# Patient Record
Sex: Male | Born: 1970 | Race: White | Hispanic: No | Marital: Married | Smoking: Current every day smoker
Health system: Southern US, Community
[De-identification: ages and names within clinical notes are randomized; demographics above are authoritative.]

## PROBLEM LIST (undated history)

## (undated) DIAGNOSIS — M109 Gout, unspecified: Secondary | ICD-10-CM

## (undated) DIAGNOSIS — I10 Essential (primary) hypertension: Secondary | ICD-10-CM

## (undated) DIAGNOSIS — K5792 Diverticulitis of intestine, part unspecified, without perforation or abscess without bleeding: Secondary | ICD-10-CM

## (undated) HISTORY — DX: Diverticulitis of intestine, part unspecified, without perforation or abscess without bleeding: K57.92

## (undated) HISTORY — PX: OTHER SURGICAL HISTORY: SHX169

---

## 2002-06-01 ENCOUNTER — Ambulatory Visit (HOSPITAL_COMMUNITY): Admission: RE | Admit: 2002-06-01 | Discharge: 2002-06-01 | Payer: Self-pay | Admitting: Family Medicine

## 2002-06-01 ENCOUNTER — Encounter: Payer: Self-pay | Admitting: Family Medicine

## 2003-07-29 ENCOUNTER — Encounter: Payer: Self-pay | Admitting: Emergency Medicine

## 2003-07-29 ENCOUNTER — Inpatient Hospital Stay (HOSPITAL_COMMUNITY): Admission: EM | Admit: 2003-07-29 | Discharge: 2003-08-02 | Payer: Self-pay | Admitting: Emergency Medicine

## 2006-01-16 ENCOUNTER — Ambulatory Visit (HOSPITAL_COMMUNITY): Admission: RE | Admit: 2006-01-16 | Discharge: 2006-01-16 | Payer: Self-pay | Admitting: Family Medicine

## 2008-03-11 ENCOUNTER — Emergency Department (HOSPITAL_COMMUNITY): Admission: EM | Admit: 2008-03-11 | Discharge: 2008-03-11 | Payer: Self-pay | Admitting: Emergency Medicine

## 2010-03-14 ENCOUNTER — Ambulatory Visit (HOSPITAL_COMMUNITY): Admission: RE | Admit: 2010-03-14 | Discharge: 2010-03-14 | Payer: Self-pay | Admitting: Family Medicine

## 2011-03-30 NOTE — Consult Note (Signed)
NAME:  Steven Mcbride, Steven Mcbride                       ACCOUNT NO.:  [id number]   MEDICAL RECORD NO.:  [id number]                   PATIENT TYPE:  INP   LOCATION:  A227                                 FACILITY:  [facility]   PHYSICIAN:  Lionel December, M.D.                 DATE OF BIRTH:  19-Jul-1971   DATE OF CONSULTATION:  07/30/2003  DATE OF DISCHARGE:                                   CONSULTATION   REQUESTING PHYSICIAN:  Kirk Ruths, M.D.   REASON FOR CONSULTATION:  Abdominal pain and colitis.   HISTORY OF PRESENT ILLNESS:  [name] is a 40 year old Caucasian  male who was admitted to the hospital  on July 29, 2003, by  Dr.  Regino Schultze. He presented with acute onset left lower quadrant abdominal pain at  4 a.m. which he describes as tightening and spasms. He reports the pain  was exacerbated by movement and denies  any changes in his bowel habits. He  reports that he usually has a soft, brown daily bowel movement, however, he  can have up to 2 to 3 bowel movements per day. He denies any melena or  bright red blood per rectum. He denies any nausea or vomiting. He denies any  fevers or chills.   Since admission he was started on IV Levaquin and Flagyl and a clear liquid  diet. A CT scan of his abdomen revealed  acute sigmoid colitis, questionable  infectious versus inflammation versus diverticulitis. There was no free air  obstruction, abscess or adenopathy. He has no family history of colorectal  carcinoma. He has no GI history. He denies any dysphagia, odynophagia or  reflux. His weight has been stable.   PAST MEDICAL HISTORY:  Back injury related to a skiing accident.   PAST SURGICAL HISTORY:  Denies.   CURRENT MEDICATIONS:  Occasional aspirin or BC Powder, occasional Xanax as  needed for  anxiety.   ALLERGIES:  No known drug allergies.   FAMILY HISTORY:  No known  family history of colorectal carcinoma, GI or  liver problems. He reports his mother is 23, alive  and well and healthy. His  father is 95 with hypertension. He has 1 twin brother who is 82 and healthy  and 1 half sister who is also healthy.   SOCIAL HISTORY:  He is  currently not married. He does  have  one 53-year-old  daughter who is healthy. He is employed full time as an Art gallery manager. He has  smoked a pack per day for 14 years now. He reports alcohol  consumption of  20 or so drinks a week. He denies any drug use.   REVIEW OF SYSTEMS:  CONSTITUTIONAL:  He denies  any fevers or chills. He  reports stable weight and good appetite. GI:  See HPI. GU:  He denies any  dysuria, hematuria, flank pain or increased urinary frequency.  MUSCULOSKELETAL:  He does report some  chronic back and knee problems,  however, he denies any recent  flares. DERMATOLOGICAL:  He denies any rash  or jaundice. HEENT: He denies  any changes in his vision.   PHYSICAL EXAMINATION:  VITAL SIGNS:  Temperature 97.3, pulse 51,  respirations 16, blood pressure 129/69.  GENERAL:  Height 71 inches, weight 196.4 pounds. [name] is a 40-year-  old well developed, well nourished white male in no acute distress. He is  alert and oriented.  HEENT:  Sclerae clear, anicteric. Conjunctivae pink. Oropharynx pink and  moist without any lesions.  NECK:  Supple and soft without masses or thyromegaly.  CHEST:  Heart regular rate and rhythm without murmur, rub or gallops.  LUNGS:  Clear to auscultation bilaterally.  ABDOMEN:  Positive bowel sounds x4, soft, nondistended, slight left lower  quadrant tenderness  on palpation, not rigid, no rebound tenderness.  Negative iliopsoas.  EXTREMITIES:  2+ pedal pulses bilaterally, no pedal edema.  SKIN: Pink, warm and dry without any rash, lesions or jaundice.   LABORATORY DATA:  WBCs 14.3, hemoglobin 14.4, hematocrit 41, platelets 218.  Calcium 9.4. Sodium 137, potassium 4.0, chloride 106, CO2 27, BUN 8,  creatinine 0.8, glucose 99. Total bilirubin 0.8, direct bilirubin less than  0.1,  alkaline phosphatase 75, SGOT 75, SGPT 57, total protein 7.1, albumin  4.0. Amylase 42, lipase 33. Urinalysis  positive for a small  amount of  blood.   ASSESSMENT:  [name] is a 40 year old Caucasian  male with left lower  quadrant abdominal pain and findings on CT scan consistent with probable  infectious colitis versus inflammatory bowel disease versus diverticulitis  (least likely). There is also a possibility of colon carcinoma, however, he  does not have any red flags, nor does he have adenopathy on CT scan;  therefore  this is the least likely.   RECOMMENDATIONS:  1. If he develops any loose stools or diarrhea will obtain stool studies for     WBCs ova and parasites and culture and sensitivity.  2. Discussed this case with Dr. Karilyn Cota. We also reviewed the CT scan.  3. Will repeat CBC in the morning.  4. Agree with antibiotic treatment which can possibly be changed to p.o.     medications  as needed.  5. If symptoms persist, may consider colonoscopy for further evaluation by     Dr. Karilyn Cota.   We would like to thank Dr. Regino Schultze for this kind referral.       Nicholas Lose, N.P.                 Lionel December, M.D.    KC/MEDQ  D:  07/30/2003  T:  07/30/2003  Job:  [id number]

## 2011-03-30 NOTE — H&P (Signed)
   NAME:  Steven Mcbride, Steven Mcbride                       ACCOUNT NO.:  [id number]   MEDICAL RECORD NO.:  [id number]                   PATIENT TYPE:  INP   LOCATION:  A227                                 FACILITY:  [facility]   PHYSICIAN:  Kirk Ruths, M.D.            DATE OF BIRTH:  May 23, 1971   DATE OF ADMISSION:  07/29/2003  DATE OF DISCHARGE:                                HISTORY & PHYSICAL   CHIEF COMPLAINT:  Abdominal pain.   PRESENTING ILLNESS:  This is a 40 year old white male who has been in  excellent health.  He awoke on the morning of admission with pain in his  left lower quadrant.  The pain got progressively worse during the day.  He  presented to the emergency room and was found to have a moderately tender  left lower quadrant with a white count of 19,000.  CT scan showed acute  sigmoid colitis not consistent with diverticulitis.  The patient was  admitted to the floor, begun on IV antibiotics.   PAST MEDICAL HISTORY:  Negative.   ALLERGIES:  Allergic to no medications.   REVIEW OF SYSTEMS:  Denies nausea, vomiting, diarrhea, or fever.   PHYSICAL EXAMINATION:  GENERAL:  Young, healthy-appearing white male in mild  distress due to abdominal pain.  VITAL SIGNS:  Blood pressure 130/70, respirations 18, pulse is 84 and  regular, afebrile.  HEENT:  TMs are normal.  Pupils equal and reactive to light and  accommodation.  Oropharynx benign.  NECK:  Supple.  Without JVD, bruit, or thyromegaly.  LUNGS:  Clear in all areas.  HEART:  Regular sinus rhythm without murmur, gallop, or rub.  ABDOMEN:  With slightly diminished bowel sounds but positive.  He has  tenderness in his left lower quadrant although abdomen is soft, without  significant rebound.  EXTREMITIES:  Without clubbing, cyanosis, or edema.  NEUROLOGIC:  Grossly intact.   ASSESSMENT:  Abdominal pain, probable colitis.      ___________________________________________  Kirk Ruths, M.D.   WMM/MEDQ  D:  07/31/2003  T:  07/31/2003  Job:  [id number]

## 2011-03-30 NOTE — Op Note (Signed)
NAME:  Steven Mcbride, Steven Mcbride                       ACCOUNT NO.:  [id number]   MEDICAL RECORD NO.:  [id number]                   PATIENT TYPE:  INP   LOCATION:  A227                                 FACILITY:  [facility]   PHYSICIAN:  Lionel December, M.D.                 DATE OF BIRTH:  07-Jul-1971   DATE OF PROCEDURE:  07/31/2003  DATE OF DISCHARGE:                                 OPERATIVE REPORT   PROCEDURE:  Total colonoscopy.   ENDOSCOPIST:  Lionel December, M.D.   INDICATIONS:  This patient is a 40 year old Caucasian male who presents with  a more or less sudden onset of left lower quadrant abdominal pain associated  with leukocytosis.  He had CT scan which showed inflammatory changes to the  sigmoid colon around it. His presentation is suspicious with ischemic  colitis, but he could have infectious colitis.  No diverticula were seen in  his colon.  Therefore, this is less likely to be diverticulitis.  He is  undergoing diagnostic colonoscopy.  He has been treated with Levaquin and  Flagyl and his leukocytosis has improved; however, his pain has not  significantly changed.  The procedure and risks were reviewed with the  patient and informed consent was obtained.   PREOPERATIVE MEDICATIONS:  Demerol 50 mg IV and Versed 8 mg IV in divided  doses.   FINDINGS:  Procedure performed in endoscopy suite.  The patient's vital  signs and O2 saturation were monitored during the procedure and remained  stable.  The patient was placed in the left lateral recumbent position and  rectal examination was performed.  No abnormality noted on external or  digital exam.   Olympus videoscope was placed in the rectum and advanced under vision into  the sigmoid colon.  Preparation was excellent.  On the way in the mucosa  looked normal throughout.  The scope was passed into the cecum which was  identified by appendiceal orifice and the ileocecal valve.  Short segment of  TL was also examined and was normal.   The colonic mucosa was, once again,  carefully examined on the way out.  There was a focal are of mucosal edema,  erythema, and a mucopurulent discharge in the center surrounded by  erythematous mucosa.  There was a single erosion.  Biopsy was taken from the  swollen abnormal mucosa.  Picture was also taken for the record.  It was  about 35 cm from the anal margin.  Mucosa of the rest of the sigmoid colon  and rectum was normal.  The scope was retroflexed to examine the anorectal  junction which was unremarkable.   The endoscope was straightened and withdrawn.  The patient tolerated the  procedure well.   FINAL DIAGNOSES:  1. Examination performed to the cecum along with inspection of terminal     ileum.  2. Focal colitis with mucopurulent discharge at sigmoid colon.  These  findings are very suspicious for a diverticulitis.  Given his     presentation and CT findings, I suspect that he may have walled off     diverticular perforation.    RECOMMENDATIONS:  As he is gradually improving will continue with therapy.  Flagyl dose increased to 500 mg IV q.6h. If he does not improve dramatically  within the next 24-48 hours or his pain gets worse, he will need a surgical  opinion.                                                Lionel December, M.D.    NR/MEDQ  D:  07/31/2003  T:  08/02/2003  Job:  [id number]   cc:   Kirk Ruths, M.D.  [address]  [place]  [place] [address]  Fax: [phone]

## 2011-03-30 NOTE — Discharge Summary (Signed)
   NAME:  Steven Mcbride, Steven Mcbride                       ACCOUNT NO.:  [id number]   MEDICAL RECORD NO.:  [id number]                   PATIENT TYPE:  INP   LOCATION:  A227                                 FACILITY:  [facility]   PHYSICIAN:  Kirk Ruths, M.D.            DATE OF BIRTH:  03/21/1971   DATE OF ADMISSION:  07/29/2003  DATE OF DISCHARGE:  08/02/2003                                 DISCHARGE SUMMARY   DISCHARGE DIAGNOSES:  Perforated diverticulum versus segmental colitis of  the sigmoid colon.   HOSPITAL COURSE:  This 40 year old white male was admitted through the  emergency room with approximately a 12-hour history of sudden and  progressive left lower quadrant pain.  The patient had some significant  tenderness. White count of 19,000 on admission.  CT scan of the colon was  read as probable ischemic colitis, less likely diverticulitis.  The patient  was admitted to the floor, begun on IV Levaquin and Flagyl.  White count  defervesced to 14,000 the next day.  His temperature remained essentially  afebrile throughout his stay.  Each day his abdominal pain significantly  improved.  His diet was advanced.  He underwent a colonoscopy per Dr.  Karilyn Cota, which again - we found focal colitis at the sigmoid, possibly  secondary to a diverticulitis with a walled-off perforation.  The patient  was tolerating a regular diet, with no pain at the time of discharge.  He  will be followed by Dr. Karilyn Cota in two months.  He was discharged home on  Levaquin and Flagyl and Cipro for two weeks, as well as diverticular diet.     ___________________________________________                                         Kirk Ruths, M.D.   WMM/MEDQ  D:  08/02/2003  T:  08/02/2003  Job:  [id number]

## 2011-08-07 LAB — COMPREHENSIVE METABOLIC PANEL
ALT: 31
AST: 21
Alkaline Phosphatase: 61
CO2: 23
GFR calc Af Amer: >60
Glucose, Bld: 123 — ABNORMAL HIGH
Potassium: 3.5
Sodium: 134 — ABNORMAL LOW
Total Protein: 6.5

## 2011-08-07 LAB — DIFFERENTIAL
Basophils Relative: 1
Eosinophils Absolute: 0
Eosinophils Relative: 0
Monocytes Absolute: 0.9
Monocytes Relative: 5
Neutrophils Relative %: 91 — ABNORMAL HIGH

## 2011-08-07 LAB — URINALYSIS, ROUTINE W REFLEX MICROSCOPIC
Bilirubin Urine: NEGATIVE
Glucose, UA: NEGATIVE
Ketones, ur: NEGATIVE
Leukocytes, UA: NEGATIVE
Nitrite: NEGATIVE
Protein, ur: NEGATIVE
Specific Gravity, Urine: >1.03 — ABNORMAL HIGH
Urobilinogen, UA: 0.2
pH: 5

## 2011-08-07 LAB — CBC
Hemoglobin: 15.1
RBC: 4.7
RDW: 12.3

## 2011-08-07 LAB — URINE MICROSCOPIC-ADD ON

## 2013-07-28 ENCOUNTER — Telehealth: Payer: Self-pay

## 2013-07-28 NOTE — Telephone Encounter (Signed)
Pt was referred by Dr. Regino Schultze for screening colonoscopy. His last one was 07/31/2003 by NUR. He is not having any problems at this time. He does have a hx of diverticulitis, but not having any concerns at this time. ( He is only 42 years old). Please advise!

## 2013-07-28 NOTE — Telephone Encounter (Signed)
No polyps on the colonosopy in 2004. Per AS if no family hx of colon cancer, no colonoscopy needed at this time.

## 2013-07-28 NOTE — Telephone Encounter (Signed)
Need op note with path. Any history of polyps?

## 2013-07-29 NOTE — Telephone Encounter (Signed)
Agree 

## 2013-07-29 NOTE — Telephone Encounter (Signed)
Spoke to pt. He is not having any GI problems and has no family hx of colon cancer. He will call if he has problmes or someone in family is diagnosed with colon cancer. Otherwise, he will be due for colonoscopy at age 42.

## 2014-12-22 ENCOUNTER — Other Ambulatory Visit (HOSPITAL_COMMUNITY): Payer: Self-pay | Admitting: Family Medicine

## 2014-12-22 ENCOUNTER — Ambulatory Visit (HOSPITAL_COMMUNITY)
Admission: RE | Admit: 2014-12-22 | Discharge: 2014-12-22 | Disposition: A | Discharge status: Home / Self Care | Payer: BLUE CROSS/BLUE SHIELD | Source: Ambulatory Visit | Attending: Family Medicine | Admitting: Family Medicine

## 2014-12-22 DIAGNOSIS — M79671 Pain in right foot: Secondary | ICD-10-CM | POA: Insufficient documentation

## 2016-03-29 DIAGNOSIS — Z6829 Body mass index (BMI) 29.0-29.9, adult: Secondary | ICD-10-CM | POA: Diagnosis not present

## 2016-03-29 DIAGNOSIS — Z1389 Encounter for screening for other disorder: Secondary | ICD-10-CM | POA: Diagnosis not present

## 2016-03-29 DIAGNOSIS — J069 Acute upper respiratory infection, unspecified: Secondary | ICD-10-CM | POA: Diagnosis not present

## 2016-03-29 DIAGNOSIS — J209 Acute bronchitis, unspecified: Secondary | ICD-10-CM | POA: Diagnosis not present

## 2016-07-11 DIAGNOSIS — M10071 Idiopathic gout, right ankle and foot: Secondary | ICD-10-CM | POA: Diagnosis not present

## 2016-07-11 DIAGNOSIS — M79671 Pain in right foot: Secondary | ICD-10-CM | POA: Diagnosis not present

## 2016-10-18 DIAGNOSIS — S0501XA Injury of conjunctiva and corneal abrasion without foreign body, right eye, initial encounter: Secondary | ICD-10-CM | POA: Diagnosis not present

## 2016-10-22 DIAGNOSIS — S0501XD Injury of conjunctiva and corneal abrasion without foreign body, right eye, subsequent encounter: Secondary | ICD-10-CM | POA: Diagnosis not present

## 2016-12-26 DIAGNOSIS — J019 Acute sinusitis, unspecified: Secondary | ICD-10-CM | POA: Diagnosis not present

## 2016-12-26 DIAGNOSIS — R6889 Other general symptoms and signs: Secondary | ICD-10-CM | POA: Diagnosis not present

## 2016-12-26 DIAGNOSIS — Z6831 Body mass index (BMI) 31.0-31.9, adult: Secondary | ICD-10-CM | POA: Diagnosis not present

## 2016-12-26 DIAGNOSIS — R05 Cough: Secondary | ICD-10-CM | POA: Diagnosis not present

## 2016-12-26 DIAGNOSIS — B349 Viral infection, unspecified: Secondary | ICD-10-CM | POA: Diagnosis not present

## 2016-12-26 DIAGNOSIS — Z1389 Encounter for screening for other disorder: Secondary | ICD-10-CM | POA: Diagnosis not present

## 2017-04-22 DIAGNOSIS — E782 Mixed hyperlipidemia: Secondary | ICD-10-CM | POA: Diagnosis not present

## 2017-04-22 DIAGNOSIS — E669 Obesity, unspecified: Secondary | ICD-10-CM | POA: Diagnosis not present

## 2017-04-22 DIAGNOSIS — Z6830 Body mass index (BMI) 30.0-30.9, adult: Secondary | ICD-10-CM | POA: Diagnosis not present

## 2017-04-22 DIAGNOSIS — I10 Essential (primary) hypertension: Secondary | ICD-10-CM | POA: Diagnosis not present

## 2017-04-22 DIAGNOSIS — M109 Gout, unspecified: Secondary | ICD-10-CM | POA: Diagnosis not present

## 2017-04-22 DIAGNOSIS — Z1389 Encounter for screening for other disorder: Secondary | ICD-10-CM | POA: Diagnosis not present

## 2017-04-25 DIAGNOSIS — H11421 Conjunctival edema, right eye: Secondary | ICD-10-CM | POA: Diagnosis not present

## 2017-05-20 DIAGNOSIS — S81852A Open bite, left lower leg, initial encounter: Secondary | ICD-10-CM | POA: Diagnosis not present

## 2017-05-20 DIAGNOSIS — Z23 Encounter for immunization: Secondary | ICD-10-CM | POA: Diagnosis not present

## 2017-05-20 DIAGNOSIS — Z1389 Encounter for screening for other disorder: Secondary | ICD-10-CM | POA: Diagnosis not present

## 2017-05-20 DIAGNOSIS — E6609 Other obesity due to excess calories: Secondary | ICD-10-CM | POA: Diagnosis not present

## 2017-05-20 DIAGNOSIS — L089 Local infection of the skin and subcutaneous tissue, unspecified: Secondary | ICD-10-CM | POA: Diagnosis not present

## 2017-05-20 DIAGNOSIS — Z6830 Body mass index (BMI) 30.0-30.9, adult: Secondary | ICD-10-CM | POA: Diagnosis not present

## 2017-05-28 ENCOUNTER — Telehealth: Payer: Self-pay

## 2017-05-28 NOTE — Telephone Encounter (Signed)
[phone]  Patient received letter to schedule tcs

## 2017-06-04 NOTE — Telephone Encounter (Signed)
LMOM to call.

## 2017-06-13 NOTE — Telephone Encounter (Signed)
To Ginger to triage.  

## 2017-06-13 NOTE — Telephone Encounter (Signed)
LMOM to call back

## 2017-06-14 NOTE — Telephone Encounter (Signed)
LMOM to call back

## 2017-06-18 ENCOUNTER — Telehealth: Payer: Self-pay

## 2017-06-18 NOTE — Telephone Encounter (Signed)
Gastroenterology Pre-Procedure Review  Request Date: Requesting Physician: Dr.GOLDING  LAST TCS ABOUT 10 YEARS AGO (?)  PATIENT REVIEW QUESTIONS: The patient responded to the following health history questions as indicated:    1. Diabetes Melitis: NO 2. Joint replacements in the past 12 months: NO 3. Major health problems in the past 3 months: NO 4. Has an artificial valve or MVP: NO 5. Has a defibrillator: NO 6. Has been advised in past to take antibiotics in advance of a procedure like teeth cleaning: NO 7. Family history of colon cancer: NO 8. Alcohol Use: YES; 3-4 DRINK 4 TIMES A WEEK 9. History of sleep apnea: NO 10. History of coronary artery or other vascular stents placed within the last 12 months: NO 11. History of any prior anesthesia complications: NO    MEDICATIONS & ALLERGIES:    Patient reports the following regarding taking any blood thinners:   Plavix? NO Aspirin? NO Coumadin? NO Brilinta? NO Xarelto? NO Eliquis? NO Pradaxa? NO Savaysa? NO Effient? NO  Patient confirms/reports the following medications:  No current outpatient prescriptions on file.   No current facility-administered medications for this visit.     Patient confirms/reports the following allergies:  No Known Allergies  No orders of the defined types were placed in this encounter.   AUTHORIZATION INFORMATION Primary Insurance: [place],  [place]:  [id number],  Group #: [id number] Pre-Cert / Berkley HarveyAuth required:  Pre-Cert / Auth #:   SCHEDULE INFORMATION: Procedure has been scheduled as follows:  Date: , Time:   Location:   This Gastroenterology Pre-Precedure Review Form is being routed to the following provider(s):

## 2017-06-18 NOTE — Telephone Encounter (Signed)
Tried to call with no answer. Letter mailed out for him to call

## 2017-06-20 NOTE — Telephone Encounter (Signed)
He answer no to all the questions. He said that he has diverticulitis flares 3-4 times a year and some are sever. He thinks his wife has called the insurance and they will cover but he is going to make sure. Pleas advise

## 2017-06-20 NOTE — Telephone Encounter (Signed)
LMOM to call back

## 2017-06-20 NOTE — Telephone Encounter (Signed)
Sounds like he needs OV to discuss form of sedation given etoh use. Likely needs proprofol.

## 2017-06-20 NOTE — Telephone Encounter (Signed)
Patient is only [age] years old. No h/o colon polyps on prior TCS.  DOES HE HAVE ANY FAMILY HISTORY OF COLON CANCER? IS HE HAVING ANY PROBLEMS? IS HE AFRICAN-AMERICAN?  IF ANSWERED NO TO THE ABOVE QUESTIONS, THEN I WOULD RECOMMEND HE FIND OUT IF HIS INSURANCE WILL PAY FOR A SCREENING TCS AT AGE 46.   NEW GUIDELINES FROM [employer] ARE ENCOURAGING TCS AT AGE 87 BUT INSURANCE MAY NOT HAVE GOTTEN ON BOARD YET.

## 2017-06-24 NOTE — Telephone Encounter (Signed)
Office visit set up for 08/15/17 @ 2:00 with LSL

## 2017-07-19 DIAGNOSIS — M25512 Pain in left shoulder: Secondary | ICD-10-CM | POA: Diagnosis not present

## 2017-07-19 DIAGNOSIS — M1000 Idiopathic gout, unspecified site: Secondary | ICD-10-CM | POA: Diagnosis not present

## 2017-07-19 DIAGNOSIS — Z1389 Encounter for screening for other disorder: Secondary | ICD-10-CM | POA: Diagnosis not present

## 2017-07-19 DIAGNOSIS — Z6830 Body mass index (BMI) 30.0-30.9, adult: Secondary | ICD-10-CM | POA: Diagnosis not present

## 2017-08-15 ENCOUNTER — Encounter: Payer: Self-pay | Admitting: Gastroenterology

## 2017-08-15 ENCOUNTER — Other Ambulatory Visit: Payer: Self-pay

## 2017-08-15 ENCOUNTER — Ambulatory Visit (INDEPENDENT_AMBULATORY_CARE_PROVIDER_SITE_OTHER): Payer: BLUE CROSS/BLUE SHIELD | Admitting: Gastroenterology

## 2017-08-15 ENCOUNTER — Telehealth: Payer: Self-pay

## 2017-08-15 DIAGNOSIS — K625 Hemorrhage of anus and rectum: Secondary | ICD-10-CM | POA: Diagnosis not present

## 2017-08-15 DIAGNOSIS — Z8719 Personal history of other diseases of the digestive system: Secondary | ICD-10-CM

## 2017-08-15 DIAGNOSIS — K5792 Diverticulitis of intestine, part unspecified, without perforation or abscess without bleeding: Secondary | ICD-10-CM | POA: Insufficient documentation

## 2017-08-15 MED ORDER — PEG 3350-KCL-NA BICARB-NACL 420 G PO SOLR: 4000.0000 mL | ORAL | 0 refills | Status: DC

## 2017-08-15 NOTE — Progress Notes (Signed)
cc'ed to pcp °

## 2017-08-15 NOTE — Progress Notes (Signed)
Primary Care Physician:  Ladon Applebaum Referring Provider: Dr. Sherwood Gambler Primary Gastroenterologist:  Roetta Sessions, MD   Chief Complaint  Patient presents with  . Colonoscopy    last tcs approx 10 yrs ago  . Abdominal Pain    HPI:  Steven Mcbride is a 46 y.o. male here At the request Dr. Sherwood Gambler for colonoscopy. Patient has a history of recurrent diverticulitis for the past 10-12 years. He states he's had at least 10 episodes, 6 requiring antibiotic use. Initial episode resulted in hospitalization back in 2004 and was diagnosed at time of colonoscopy. CT finding at that time was more suggestive of sigmoid colitis, no diverticular seen on CT. But on colonoscopy he had focal colitis with mucopurulent discharge at the sigmoid colon was suspicious for diverticulitis with walled off perforation.  He reports earlier this week he started having typical left lower quadrant pain as in the past. He changed his diet to clear liquids and feels much better today. There've been many occasions has been able to control disease with change in dietary measures. Denies fever. Abdominal pain much improved. Bowel function unremarkable. He does have intermittent rectal bleeding which is often around the time of his left lower quadrant pain. No upper GI symptoms. No fever. No family history of colon cancer.  Current Outpatient Prescriptions  Medication Sig Dispense Refill  . Aspirin-Salicylamide-Caffeine (BC HEADACHE POWDER PO) Take by mouth as needed.    . naproxen sodium (ANAPROX) 220 MG tablet Take 440 mg by mouth as needed.     No current facility-administered medications for this visit.     Allergies as of 08/15/2017  . (No Known Allergies)    Past Medical History:  Diagnosis Date  . Diverticulitis     Past Surgical History:  Procedure Laterality Date  . None      Family History  Problem Relation Age of Onset  . Colon cancer Neg Hx     Social History   Social History  . Marital  status: Married    Spouse name: N/A  . Number of children: N/A  . Years of education: N/A   Occupational History  . Not on file.   Social History Main Topics  . Smoking status: Current Every Day Smoker    Types: Cigarettes  . Smokeless tobacco: Never Used  . Alcohol use Yes     Comment: 4x/week-3-4 beers, glasses of wine, or mixed drinks.  . Drug use: No  . Sexual activity: Not on file   Other Topics Concern  . Not on file   Social History Narrative  . No narrative on file      ROS:  General: Negative for anorexia, weight loss, fever, chills, fatigue, weakness. Eyes: Negative for vision changes.  ENT: Negative for hoarseness, difficulty swallowing , nasal congestion. CV: Negative for chest pain, angina, palpitations, dyspnea on exertion, peripheral edema.  Respiratory: Negative for dyspnea at rest, dyspnea on exertion, cough, sputum, wheezing.  GI: See history of present illness. GU:  Negative for dysuria, hematuria, urinary incontinence, urinary frequency, nocturnal urination.  MS: Negative for joint pain, low back pain.  Derm: Negative for rash or itching.  Neuro: Negative for weakness, abnormal sensation, seizure, frequent headaches, memory loss, confusion.  Psych: Negative for anxiety, depression, suicidal ideation, hallucinations.  Endo: Negative for unusual weight change.  Heme: Negative for bruising or bleeding. Allergy: Negative for rash or hives.    Physical Examination:  BP (!) 152/102   Pulse 98   Temp (!)  97.5 F (36.4 C) (Oral)   Ht  (1.803 m)   Wt 208 lb 12.8 oz (94.7 kg)   BMI 29.12 kg/m    General: Well-nourished, well-developed in no acute distress.  Head: Normocephalic, atraumatic.   Eyes: Conjunctiva pink, no icterus. Mouth: Oropharyngeal mucosa moist and pink , no lesions erythema or exudate. Neck: Supple without thyromegaly, masses, or lymphadenopathy.  Lungs: Clear to auscultation bilaterally.  Heart: Regular rate and rhythm, no  murmurs rubs or gallops.  Abdomen: Bowel sounds are normal,nondistended, no hepatosplenomegaly or masses, no abdominal bruits or    hernia , no rebound or guarding.  Minimal left lower quadrant tenderness Rectal: Not performed Extremities: No lower extremity edema. No clubbing or deformities.  Neuro: Alert and oriented x 4 , grossly normal neurologically.  Skin: Warm and dry, no rash or jaundice.   Psych: Alert and cooperative, normal mood and affect.  Imaging Studies: No results found.

## 2017-08-15 NOTE — Assessment & Plan Note (Signed)
46 year old gentleman with intermittent left lower quadrant pain followed by small-volume hematochezia he gives a history of recurrent diverticulitis over the past 14 years. Initial episode required hospitalization and colonoscopy as outlined above. Recent episode began on Tuesday. Nearly 100% better today. States he doesn't always require antibiotic therapy. Given significant improvement we will hold off on antibiotics at this time. We'll plan on colonoscopy about 3-4 weeks, deep sedation in the OR given frequent alcohol use.  I have discussed the risks, alternatives, benefits with regards to but not limited to the risk of reaction to medication, bleeding, infection, perforation and the patient is agreeable to proceed. Written consent to be obtained.  If patient has worsening abdominal pain, would opt to treat with Cipro and Flagyl and postpone colonoscopy. Patient understands reasoning behind this. He will let me know if symptoms progress.

## 2017-08-15 NOTE — Telephone Encounter (Signed)
Tried to call pt to inform of pre-op appt 09/12/17 at 10:00am. Letter mailed.

## 2017-08-15 NOTE — Patient Instructions (Signed)
1. Colonoscopy as scheduled. Please see separate instructions. 

## 2017-09-10 NOTE — Patient Instructions (Signed)
Steven Mcbride  09/10/2017     @PREFPERIOPPHARMACY @   Your procedure is scheduled on  09/16/2017   [facility] at  800  A.M.  Call this number if you have problems the morning of surgery:  [phone]   Remember:  Do not eat food or drink liquids after midnight.  Take these medicines the morning of surgery with A SIP OF WATER  None   Do not wear jewelry, make-up or nail polish.  Do not wear lotions, powders, or perfumes, or deoderant.  Do not shave 48 hours prior to surgery.  Men may shave face and neck.  Do not bring valuables to the hospital.  [facility] is not responsible for any belongings or valuables.  Contacts, dentures or bridgework may not be worn into surgery.  Leave your suitcase in the car.  After surgery it may be brought to your room.  For patients admitted to the hospital, discharge time will be determined by your treatment team.  Patients discharged the day of surgery will not be allowed to drive home.   Name and phone number of your driver:   family Special instructions:  Follow the diet and prep instructions given to you by Dr Luvenia Starchourk's office.  Please read over the following fact sheets that you were given. Anesthesia Post-op Instructions and Care and Recovery After Surgery       Colonoscopy, Adult A colonoscopy is an exam to look at the entire large intestine. During the exam, a lubricated, bendable tube is inserted into the anus and then passed into the rectum, colon, and other parts of the large intestine. A colonoscopy is often done as a part of normal colorectal screening or in response to certain symptoms, such as anemia, persistent diarrhea, abdominal pain, and blood in the stool. The exam can help screen for and diagnose medical problems, including:  Tumors.  Polyps.  Inflammation.  Areas of bleeding.  Tell a health care provider about:  Any allergies you have.  All medicines you are taking, including  vitamins, herbs, eye drops, creams, and over-the-counter medicines.  Any problems you or family members have had with anesthetic medicines.  Any blood disorders you have.  Any surgeries you have had.  Any medical conditions you have.  Any problems you have had passing stool. What are the risks? Generally, this is a safe procedure. However, problems may occur, including:  Bleeding.  A tear in the intestine.  A reaction to medicines given during the exam.  Infection (rare).  What happens before the procedure? Eating and drinking restrictions Follow instructions from your health care provider about eating and drinking, which may include:  A few days before the procedure - follow a low-fiber diet. Avoid nuts, seeds, dried fruit, raw fruits, and vegetables.  1-3 days before the procedure - follow a clear liquid diet. Drink only clear liquids, such as clear broth or bouillon, black coffee or tea, clear juice, clear soft drinks or sports drinks, gelatin dessert, and popsicles. Avoid any liquids that contain red or purple dye.  On the day of the procedure - do not eat or drink anything during the 2 hours before the procedure, or within the time period that your health care provider recommends.  Bowel prep If you were prescribed an oral bowel prep to clean out your colon:  Take it as told by your health care provider. Starting the day before your procedure, you will  need to drink a large amount of medicated liquid. The liquid will cause you to have multiple loose stools until your stool is almost clear or light green.  If your skin or anus gets irritated from diarrhea, you may use these to relieve the irritation: ? Medicated wipes, such as adult wet wipes with aloe and vitamin E. ? A skin soothing-product like petroleum jelly.  If you vomit while drinking the bowel prep, take a break for up to 60 minutes and then begin the bowel prep again. If vomiting continues and you cannot take  the bowel prep without vomiting, call your health care provider.  General instructions  Ask your health care provider about changing or stopping your regular medicines. This is especially important if you are taking diabetes medicines or blood thinners.  Plan to have someone take you home from the hospital or clinic. What happens during the procedure?  An IV tube may be inserted into one of your veins.  You will be given medicine to help you relax (sedative).  To reduce your risk of infection: ? Your health care team will wash or sanitize their hands. ? Your anal area will be washed with soap.  You will be asked to lie on your side with your knees bent.  Your health care provider will lubricate a long, thin, flexible tube. The tube will have a camera and a light on the end.  The tube will be inserted into your anus.  The tube will be gently eased through your rectum and colon.  Air will be delivered into your colon to keep it open. You may feel some pressure or cramping.  The camera will be used to take images during the procedure.  A small tissue sample may be removed from your body to be examined under a microscope (biopsy). If any potential problems are found, the tissue will be sent to a lab for testing.  If small polyps are found, your health care provider may remove them and have them checked for cancer cells.  The tube that was inserted into your anus will be slowly removed. The procedure may vary among health care providers and hospitals. What happens after the procedure?  Your blood pressure, heart rate, breathing rate, and blood oxygen level will be monitored until the medicines you were given have worn off.  Do not drive for 24 hours after the exam.  You may have a small amount of blood in your stool.  You may pass gas and have mild abdominal cramping or bloating due to the air that was used to inflate your colon during the exam.  It is up to you to get the  results of your procedure. Ask your health care provider, or the department performing the procedure, when your results will be ready. This information is not intended to replace advice given to you by your health care provider. Make sure you discuss any questions you have with your health care provider. Document Released: 10/26/2000 Document Revised: 08/29/2016 Document Reviewed: 01/10/2016 Elsevier Interactive Patient Education  2018 [employer].  Colonoscopy, Adult, Care After This sheet gives you information about how to care for yourself after your procedure. Your health care provider may also give you more specific instructions. If you have problems or questions, contact your health care provider. What can I expect after the procedure? After the procedure, it is common to have:  A small amount of blood in your stool for 24 hours after the procedure.  Some gas.  Mild abdominal cramping or bloating.  Follow these instructions at home: General instructions   For the first 24 hours after the procedure: ? Do not drive or use machinery. ? Do not sign important documents. ? Do not drink alcohol. ? Do your regular daily activities at a slower pace than normal. ? Eat soft, easy-to-digest foods. ? Rest often.  Take over-the-counter or prescription medicines only as told by your health care provider.  It is up to you to get the results of your procedure. Ask your health care provider, or the department performing the procedure, when your results will be ready. Relieving cramping and bloating  Try walking around when you have cramps or feel bloated.  Apply heat to your abdomen as told by your health care provider. Use a heat source that your health care provider recommends, such as a moist heat pack or a heating pad. ? Place a towel between your skin and the heat source. ? Leave the heat on for 20-30 minutes. ? Remove the heat if your skin turns bright red. This is especially  important if you are unable to feel pain, heat, or cold. You may have a greater risk of getting burned. Eating and drinking  Drink enough fluid to keep your urine clear or pale yellow.  Resume your normal diet as instructed by your health care provider. Avoid heavy or fried foods that are hard to digest.  Avoid drinking alcohol for as long as instructed by your health care provider. Contact a health care provider if:  You have blood in your stool 2-3 days after the procedure. Get help right away if:  You have more than a small spotting of blood in your stool.  You pass large blood clots in your stool.  Your abdomen is swollen.  You have nausea or vomiting.  You have a fever.  You have increasing abdominal pain that is not relieved with medicine. This information is not intended to replace advice given to you by your health care provider. Make sure you discuss any questions you have with your health care provider. Document Released: 06/12/2004 Document Revised: 07/23/2016 Document Reviewed: 01/10/2016 Elsevier Interactive Patient Education  2018 [place] Anesthesia is a term that refers to techniques, procedures, and medicines that help a person stay safe and comfortable during a medical procedure. Monitored anesthesia care, or sedation, is one type of anesthesia. Your anesthesia specialist may recommend sedation if you will be having a procedure that does not require you to be unconscious, such as:  Cataract surgery.  A dental procedure.  A biopsy.  A colonoscopy.  During the procedure, you may receive a medicine to help you relax (sedative). There are three levels of sedation:  Mild sedation. At this level, you may feel awake and relaxed. You will be able to follow directions.  Moderate sedation. At this level, you will be sleepy. You may not remember the procedure.  Deep sedation. At this level, you will be asleep. You will not remember  the procedure.  The more medicine you are given, the deeper your level of sedation will be. Depending on how you respond to the procedure, the anesthesia specialist may change your level of sedation or the type of anesthesia to fit your needs. An anesthesia specialist will monitor you closely during the procedure. Let your health care provider know about:  Any allergies you have.  All medicines you are taking, including vitamins, herbs, eye drops, creams, and over-the-counter medicines.  Any use of steroids (by mouth or as a cream).  Any problems you or family members have had with sedatives and anesthetic medicines.  Any blood disorders you have.  Any surgeries you have had.  Any medical conditions you have, such as sleep apnea.  Whether you are pregnant or may be pregnant.  Any use of cigarettes, alcohol, or street drugs. What are the risks? Generally, this is a safe procedure. However, problems may occur, including:  Getting too much medicine (oversedation).  Nausea.  Allergic reaction to medicines.  Trouble breathing. If this happens, a breathing tube may be used to help with breathing. It will be removed when you are awake and breathing on your own.  Heart trouble.  Lung trouble.  Before the procedure Staying hydrated Follow instructions from your health care provider about hydration, which may include:  Up to 2 hours before the procedure - you may continue to drink clear liquids, such as water, clear fruit juice, black coffee, and plain tea.  Eating and drinking restrictions Follow instructions from your health care provider about eating and drinking, which may include:  8 hours before the procedure - stop eating heavy meals or foods such as meat, fried foods, or fatty foods.  6 hours before the procedure - stop eating light meals or foods, such as toast or cereal.  6 hours before the procedure - stop drinking milk or drinks that contain milk.  2 hours before  the procedure - stop drinking clear liquids.  Medicines Ask your health care provider about:  Changing or stopping your regular medicines. This is especially important if you are taking diabetes medicines or blood thinners.  Taking medicines such as aspirin and ibuprofen. These medicines can thin your blood. Do not take these medicines before your procedure if your health care provider instructs you not to.  Tests and exams  You will have a physical exam.  You may have blood tests done to show: ? How well your kidneys and liver are working. ? How well your blood can clot.  General instructions  Plan to have someone take you home from the hospital or clinic.  If you will be going home right after the procedure, plan to have someone with you for 24 hours.  What happens during the procedure?  Your blood pressure, heart rate, breathing, level of pain and overall condition will be monitored.  An IV tube will be inserted into one of your veins.  Your anesthesia specialist will give you medicines as needed to keep you comfortable during the procedure. This may mean changing the level of sedation.  The procedure will be performed. After the procedure  Your blood pressure, heart rate, breathing rate, and blood oxygen level will be monitored until the medicines you were given have worn off.  Do not drive for 24 hours if you received a sedative.  You may: ? Feel sleepy, clumsy, or nauseous. ? Feel forgetful about what happened after the procedure. ? Have a sore throat if you had a breathing tube during the procedure. ? Vomit. This information is not intended to replace advice given to you by your health care provider. Make sure you discuss any questions you have with your health care provider. Document Released: 07/25/2005 Document Revised: 04/06/2016 Document Reviewed: 02/19/2016 Elsevier Interactive Patient Education  2018 [place], Care  After These instructions provide you with information about caring for yourself after your procedure. Your health care provider may also give you more  specific instructions. Your treatment has been planned according to current medical practices, but problems sometimes occur. Call your health care provider if you have any problems or questions after your procedure. What can I expect after the procedure? After your procedure, it is common to:  Feel sleepy for several hours.  Feel clumsy and have poor balance for several hours.  Feel forgetful about what happened after the procedure.  Have poor judgment for several hours.  Feel nauseous or vomit.  Have a sore throat if you had a breathing tube during the procedure.  Follow these instructions at home: For at least 24 hours after the procedure:   Do not: ? Participate in activities in which you could fall or become injured. ? Drive. ? Use heavy machinery. ? Drink alcohol. ? Take sleeping pills or medicines that cause drowsiness. ? Make important decisions or sign legal documents. ? Take care of children on your own.  Rest. Eating and drinking  Follow the diet that is recommended by your health care provider.  If you vomit, drink water, juice, or soup when you can drink without vomiting.  Make sure you have little or no nausea before eating solid foods. General instructions  Have a responsible adult stay with you until you are awake and alert.  Take over-the-counter and prescription medicines only as told by your health care provider.  If you smoke, do not smoke without supervision.  Keep all follow-up visits as told by your health care provider. This is important. Contact a health care provider if:  You keep feeling nauseous or you keep vomiting.  You feel light-headed.  You develop a rash.  You have a fever. Get help right away if:  You have trouble breathing. This information is not intended to replace advice  given to you by your health care provider. Make sure you discuss any questions you have with your health care provider. Document Released: 02/19/2016 Document Revised: 06/20/2016 Document Reviewed: 02/19/2016 Elsevier Interactive Patient Education  [employer].

## 2017-09-12 ENCOUNTER — Encounter (HOSPITAL_COMMUNITY): Payer: Self-pay

## 2017-09-12 ENCOUNTER — Encounter (HOSPITAL_COMMUNITY)
Admission: RE | Admit: 2017-09-12 | Discharge: 2017-09-12 | Disposition: A | Discharge status: Home / Self Care | Payer: BLUE CROSS/BLUE SHIELD | Source: Ambulatory Visit | Attending: Internal Medicine | Admitting: Internal Medicine

## 2017-09-12 DIAGNOSIS — K5732 Diverticulitis of large intestine without perforation or abscess without bleeding: Secondary | ICD-10-CM | POA: Insufficient documentation

## 2017-09-12 DIAGNOSIS — Z01818 Encounter for other preprocedural examination: Secondary | ICD-10-CM | POA: Insufficient documentation

## 2017-09-12 HISTORY — DX: Gout, unspecified: M10.9

## 2017-09-12 LAB — CBC WITH DIFFERENTIAL/PLATELET
BASOS ABS: 0.1 10*3/uL (ref 0.0–0.1)
Basophils Relative: 0 %
EOS PCT: 1 %
Eosinophils Absolute: 0.1 10*3/uL (ref 0.0–0.7)
HCT: 41.7 % (ref 39.0–52.0)
Hemoglobin: 14 g/dL (ref 13.0–17.0)
LYMPHS PCT: 11 %
Lymphs Abs: 1.9 10*3/uL (ref 0.7–4.0)
MCH: 31.4 pg (ref 26.0–34.0)
MCHC: 33.6 g/dL (ref 30.0–36.0)
MCV: 93.5 fL (ref 78.0–100.0)
Monocytes Absolute: 1.8 10*3/uL — ABNORMAL HIGH (ref 0.1–1.0)
Monocytes Relative: 11 %
Neutro Abs: 12.9 10*3/uL — ABNORMAL HIGH (ref 1.7–7.7)
Neutrophils Relative %: 77 %
PLATELETS: 372 10*3/uL (ref 150–400)
RBC: 4.46 MIL/uL (ref 4.22–5.81)
RDW: 12.3 % (ref 11.5–15.5)
WBC: 16.7 10*3/uL — ABNORMAL HIGH (ref 4.0–10.5)

## 2017-09-12 LAB — BASIC METABOLIC PANEL
Anion gap: 13 (ref 5–15)
BUN: 10 mg/dL (ref 6–20)
CALCIUM: 9.2 mg/dL (ref 8.9–10.3)
CO2: 22 mmol/L (ref 22–32)
Chloride: 98 mmol/L — ABNORMAL LOW (ref 101–111)
Creatinine, Ser: 0.74 mg/dL (ref 0.61–1.24)
GFR calc Af Amer: >60 mL/min (ref >60)
GLUCOSE: 99 mg/dL (ref 65–99)
Potassium: 4 mmol/L (ref 3.5–5.1)
Sodium: 133 mmol/L — ABNORMAL LOW (ref 135–145)

## 2017-09-15 HISTORY — PX: COLONOSCOPY: SHX174

## 2017-09-15 NOTE — Progress Notes (Signed)
Pre-op labs routed to me by RMR. WBC count up. Na slightly low.  Patient is on for colonoscopy tomorrow.  Repeat CBC in 2 weeks.

## 2017-09-16 ENCOUNTER — Other Ambulatory Visit: Payer: Self-pay

## 2017-09-16 ENCOUNTER — Ambulatory Visit (HOSPITAL_COMMUNITY): Payer: BLUE CROSS/BLUE SHIELD | Admitting: Anesthesiology

## 2017-09-16 ENCOUNTER — Telehealth: Payer: Self-pay | Admitting: *Deleted

## 2017-09-16 ENCOUNTER — Ambulatory Visit (HOSPITAL_BASED_OUTPATIENT_CLINIC_OR_DEPARTMENT_OTHER)
Admission: RE | Admit: 2017-09-16 | Discharge: 2017-09-16 | Disposition: A | Discharge status: Home / Self Care | Payer: BLUE CROSS/BLUE SHIELD | Source: Ambulatory Visit | Attending: Internal Medicine | Admitting: Internal Medicine

## 2017-09-16 ENCOUNTER — Inpatient Hospital Stay (HOSPITAL_COMMUNITY)
Admission: EM | Admit: 2017-09-16 | Discharge: 2017-09-24 | DRG: 329 | Disposition: A | Discharge status: Home Health | Payer: BLUE CROSS/BLUE SHIELD | Attending: General Surgery | Admitting: General Surgery

## 2017-09-16 ENCOUNTER — Encounter (HOSPITAL_COMMUNITY): Payer: Self-pay | Admitting: Emergency Medicine

## 2017-09-16 ENCOUNTER — Encounter (HOSPITAL_COMMUNITY)
Admission: RE | Disposition: A | Discharge status: Home / Self Care | Payer: Self-pay | Source: Ambulatory Visit | Attending: Internal Medicine

## 2017-09-16 ENCOUNTER — Encounter (HOSPITAL_COMMUNITY): Payer: Self-pay | Admitting: *Deleted

## 2017-09-16 ENCOUNTER — Other Ambulatory Visit: Payer: Self-pay | Admitting: *Deleted

## 2017-09-16 ENCOUNTER — Emergency Department (HOSPITAL_COMMUNITY): Payer: BLUE CROSS/BLUE SHIELD

## 2017-09-16 DIAGNOSIS — K922 Gastrointestinal hemorrhage, unspecified: Secondary | ICD-10-CM | POA: Diagnosis not present

## 2017-09-16 DIAGNOSIS — Z720 Tobacco use: Secondary | ICD-10-CM | POA: Diagnosis present

## 2017-09-16 DIAGNOSIS — Z801 Family history of malignant neoplasm of trachea, bronchus and lung: Secondary | ICD-10-CM

## 2017-09-16 DIAGNOSIS — K5720 Diverticulitis of large intestine with perforation and abscess without bleeding: Secondary | ICD-10-CM | POA: Diagnosis not present

## 2017-09-16 DIAGNOSIS — K5732 Diverticulitis of large intestine without perforation or abscess without bleeding: Secondary | ICD-10-CM

## 2017-09-16 DIAGNOSIS — Z7982 Long term (current) use of aspirin: Secondary | ICD-10-CM | POA: Insufficient documentation

## 2017-09-16 DIAGNOSIS — R1030 Lower abdominal pain, unspecified: Secondary | ICD-10-CM

## 2017-09-16 DIAGNOSIS — K921 Melena: Secondary | ICD-10-CM

## 2017-09-16 DIAGNOSIS — K5730 Diverticulosis of large intestine without perforation or abscess without bleeding: Secondary | ICD-10-CM

## 2017-09-16 DIAGNOSIS — K5669 Other partial intestinal obstruction: Secondary | ICD-10-CM | POA: Diagnosis not present

## 2017-09-16 DIAGNOSIS — K5792 Diverticulitis of intestine, part unspecified, without perforation or abscess without bleeding: Secondary | ICD-10-CM | POA: Diagnosis present

## 2017-09-16 DIAGNOSIS — K625 Hemorrhage of anus and rectum: Secondary | ICD-10-CM

## 2017-09-16 DIAGNOSIS — R109 Unspecified abdominal pain: Secondary | ICD-10-CM

## 2017-09-16 DIAGNOSIS — K56699 Other intestinal obstruction unspecified as to partial versus complete obstruction: Secondary | ICD-10-CM | POA: Diagnosis not present

## 2017-09-16 DIAGNOSIS — K567 Ileus, unspecified: Secondary | ICD-10-CM | POA: Diagnosis not present

## 2017-09-16 DIAGNOSIS — Z23 Encounter for immunization: Secondary | ICD-10-CM | POA: Diagnosis not present

## 2017-09-16 DIAGNOSIS — Z79899 Other long term (current) drug therapy: Secondary | ICD-10-CM

## 2017-09-16 DIAGNOSIS — Z8249 Family history of ischemic heart disease and other diseases of the circulatory system: Secondary | ICD-10-CM

## 2017-09-16 DIAGNOSIS — K651 Peritoneal abscess: Secondary | ICD-10-CM | POA: Diagnosis present

## 2017-09-16 DIAGNOSIS — Z8719 Personal history of other diseases of the digestive system: Secondary | ICD-10-CM

## 2017-09-16 DIAGNOSIS — M109 Gout, unspecified: Secondary | ICD-10-CM | POA: Diagnosis not present

## 2017-09-16 DIAGNOSIS — R1031 Right lower quadrant pain: Secondary | ICD-10-CM | POA: Diagnosis not present

## 2017-09-16 DIAGNOSIS — R194 Change in bowel habit: Secondary | ICD-10-CM

## 2017-09-16 DIAGNOSIS — F17210 Nicotine dependence, cigarettes, uncomplicated: Secondary | ICD-10-CM | POA: Diagnosis present

## 2017-09-16 HISTORY — PX: BIOPSY: SHX5522

## 2017-09-16 HISTORY — PX: COLONOSCOPY WITH PROPOFOL: SHX5780

## 2017-09-16 LAB — URINALYSIS, DIPSTICK ONLY
Bilirubin Urine: NEGATIVE
GLUCOSE, UA: NEGATIVE mg/dL
HGB URINE DIPSTICK: NEGATIVE
KETONES UR: NEGATIVE mg/dL
LEUKOCYTES UA: NEGATIVE
Nitrite: NEGATIVE
PROTEIN: NEGATIVE mg/dL
Specific Gravity, Urine: 1.006 (ref 1.005–1.030)
pH: 6 (ref 5.0–8.0)

## 2017-09-16 LAB — COMPREHENSIVE METABOLIC PANEL
ALK PHOS: 71 U/L (ref 38–126)
ALT: 34 U/L (ref 17–63)
ANION GAP: 14 (ref 5–15)
AST: 17 U/L (ref 15–41)
Albumin: 3.5 g/dL (ref 3.5–5.0)
BILIRUBIN TOTAL: 0.4 mg/dL (ref 0.3–1.2)
BUN: 10 mg/dL (ref 6–20)
CALCIUM: 9.2 mg/dL (ref 8.9–10.3)
CO2: 23 mmol/L (ref 22–32)
Chloride: 99 mmol/L — ABNORMAL LOW (ref 101–111)
Creatinine, Ser: 0.69 mg/dL (ref 0.61–1.24)
GFR calc Af Amer: >60 mL/min (ref >60)
GLUCOSE: 94 mg/dL (ref 65–99)
Potassium: 3.5 mmol/L (ref 3.5–5.1)
Sodium: 136 mmol/L (ref 135–145)
TOTAL PROTEIN: 7.3 g/dL (ref 6.5–8.1)

## 2017-09-16 LAB — CBC WITH DIFFERENTIAL/PLATELET
Basophils Absolute: 0 10*3/uL (ref 0.0–0.1)
Basophils Relative: 0 %
EOS ABS: 0.2 10*3/uL (ref 0.0–0.7)
EOS PCT: 1 %
HEMATOCRIT: 38.7 % — AB (ref 39.0–52.0)
HEMOGLOBIN: 13.2 g/dL (ref 13.0–17.0)
Lymphocytes Relative: 16 %
Lymphs Abs: 2 10*3/uL (ref 0.7–4.0)
MCH: 31.4 pg (ref 26.0–34.0)
MCHC: 34.1 g/dL (ref 30.0–36.0)
MCV: 92.1 fL (ref 78.0–100.0)
MONO ABS: 1.3 10*3/uL — AB (ref 0.1–1.0)
MONOS PCT: 10 %
NEUTROS ABS: 9.4 10*3/uL — AB (ref 1.7–7.7)
Neutrophils Relative %: 73 %
Platelets: 362 10*3/uL (ref 150–400)
RBC: 4.2 MIL/uL — ABNORMAL LOW (ref 4.22–5.81)
RDW: 12.4 % (ref 11.5–15.5)
WBC: 12.9 10*3/uL — ABNORMAL HIGH (ref 4.0–10.5)

## 2017-09-16 SURGERY — COLONOSCOPY WITH PROPOFOL
Anesthesia: Monitor Anesthesia Care

## 2017-09-16 MED ORDER — FENTANYL CITRATE (PF) 100 MCG/2ML IJ SOLN
INTRAMUSCULAR | Status: AC
  Filled 2017-09-16: qty 2

## 2017-09-16 MED ORDER — PROPOFOL 10 MG/ML IV BOLUS
INTRAVENOUS | Status: DC | PRN
  Administered 2017-09-16 (×8): 20 mg via INTRAVENOUS

## 2017-09-16 MED ORDER — MIDAZOLAM HCL 2 MG/2ML IJ SOLN
1.0000 mg | Freq: Once | INTRAMUSCULAR | Status: AC | PRN
  Administered 2017-09-16: 2 mg via INTRAVENOUS

## 2017-09-16 MED ORDER — SODIUM CHLORIDE 0.9 % IJ SOLN
INTRAMUSCULAR | Status: AC
  Filled 2017-09-16: qty 10

## 2017-09-16 MED ORDER — METRONIDAZOLE IN NACL 5-0.79 MG/ML-% IV SOLN
500.0000 mg | Freq: Once | INTRAVENOUS | Status: AC
  Administered 2017-09-17: 500 mg via INTRAVENOUS
  Filled 2017-09-16: qty 100

## 2017-09-16 MED ORDER — ONDANSETRON 4 MG PO TBDP
4.0000 mg | ORAL_TABLET | Freq: Once | ORAL | Status: AC
  Administered 2017-09-16: 4 mg via ORAL

## 2017-09-16 MED ORDER — HYDROMORPHONE HCL 1 MG/ML IJ SOLN
INTRAMUSCULAR | Status: AC
  Filled 2017-09-16: qty 1

## 2017-09-16 MED ORDER — IOPAMIDOL (ISOVUE-300) INJECTION 61%
100.0000 mL | Freq: Once | INTRAVENOUS | Status: AC | PRN
  Administered 2017-09-16: 100 mL via INTRAVENOUS

## 2017-09-16 MED ORDER — ONDANSETRON HCL 4 MG/2ML IJ SOLN
4.0000 mg | Freq: Once | INTRAMUSCULAR | Status: AC
Start: 2017-09-16 — End: 2017-09-16
  Administered 2017-09-16: 4 mg via INTRAVENOUS
  Filled 2017-09-16: qty 2

## 2017-09-16 MED ORDER — CHLORHEXIDINE GLUCONATE CLOTH 2 % EX PADS: 6.0000 | MEDICATED_PAD | Freq: Once | CUTANEOUS | Status: DC

## 2017-09-16 MED ORDER — HYDROMORPHONE HCL 1 MG/ML IJ SOLN
1.0000 mg | Freq: Once | INTRAMUSCULAR | Status: AC
  Administered 2017-09-16: 1 mg via INTRAVENOUS
  Filled 2017-09-16: qty 1

## 2017-09-16 MED ORDER — MIDAZOLAM HCL 2 MG/2ML IJ SOLN
INTRAMUSCULAR | Status: AC
  Filled 2017-09-16: qty 2

## 2017-09-16 MED ORDER — FENTANYL CITRATE (PF) 100 MCG/2ML IJ SOLN
25.0000 ug | Freq: Once | INTRAMUSCULAR | Status: AC
  Administered 2017-09-16: 25 ug via INTRAVENOUS

## 2017-09-16 MED ORDER — ONDANSETRON HCL 4 MG/2ML IJ SOLN
4.0000 mg | Freq: Once | INTRAMUSCULAR | Status: AC
  Administered 2017-09-16: 4 mg via INTRAVENOUS
  Filled 2017-09-16: qty 2

## 2017-09-16 MED ORDER — LACTATED RINGERS IV SOLN
INTRAVENOUS | Status: DC
  Administered 2017-09-16: 08:00:00 via INTRAVENOUS

## 2017-09-16 MED ORDER — HYDROMORPHONE HCL 1 MG/ML IJ SOLN
0.5000 mg | INTRAMUSCULAR | Status: AC | PRN
  Administered 2017-09-16 (×4): 0.5 mg via INTRAVENOUS
  Filled 2017-09-16: qty 1

## 2017-09-16 MED ORDER — ATROPINE SULFATE 0.4 MG/ML IJ SOLN
INTRAMUSCULAR | Status: AC
  Filled 2017-09-16: qty 1

## 2017-09-16 MED ORDER — PROPOFOL 10 MG/ML IV BOLUS
INTRAVENOUS | Status: AC
  Filled 2017-09-16: qty 60

## 2017-09-16 MED ORDER — PROPOFOL 500 MG/50ML IV EMUL
INTRAVENOUS | Status: DC | PRN
  Administered 2017-09-16: 175 ug/kg/min via INTRAVENOUS
  Administered 2017-09-16: 125 ug/kg/min via INTRAVENOUS
  Administered 2017-09-16: 180 ug/kg/min via INTRAVENOUS
  Administered 2017-09-16: 170 ug/kg/min via INTRAVENOUS
  Administered 2017-09-16: 180 ug/kg/min via INTRAVENOUS
  Administered 2017-09-16: 175 ug/kg/min via INTRAVENOUS

## 2017-09-16 MED ORDER — LIDOCAINE HCL (PF) 1 % IJ SOLN
INTRAMUSCULAR | Status: AC
  Filled 2017-09-16: qty 5

## 2017-09-16 MED ORDER — PROPOFOL 10 MG/ML IV BOLUS
INTRAVENOUS | Status: AC
  Filled 2017-09-16: qty 80

## 2017-09-16 MED ORDER — CIPROFLOXACIN IN D5W 400 MG/200ML IV SOLN
400.0000 mg | Freq: Once | INTRAVENOUS | Status: AC
  Administered 2017-09-16: 400 mg via INTRAVENOUS
  Filled 2017-09-16: qty 200

## 2017-09-16 MED ORDER — SODIUM CHLORIDE 0.9 % IV BOLUS (SEPSIS)
500.0000 mL | Freq: Once | INTRAVENOUS | Status: AC
  Administered 2017-09-16: 500 mL via INTRAVENOUS

## 2017-09-16 MED ORDER — ONDANSETRON 4 MG PO TBDP
ORAL_TABLET | ORAL | Status: AC
  Filled 2017-09-16: qty 1

## 2017-09-16 NOTE — Anesthesia Postprocedure Evaluation (Signed)
Anesthesia Post Note  Patient: Steven Mcbride  Procedure(s) Performed: COLONOSCOPY WITH PROPOFOL (N/A ) BIOPSY  Patient location during evaluation: PACU Anesthesia Type: MAC Level of consciousness: awake and alert Pain management: satisfactory to patient Vital Signs Assessment: post-procedure vital signs reviewed and stable Respiratory status: spontaneous breathing and patient connected to nasal cannula oxygen Cardiovascular status: stable Postop Assessment: no apparent nausea or vomiting Anesthetic complications: no     Last Vitals:  Vitals:   09/16/17 1053 09/16/17 1100  BP:  (!) 139/91  Pulse: 95 95  Resp: 16 15  Temp:    SpO2: 98% 97%    Last Pain:  Vitals:   09/16/17 1100  TempSrc:   PainSc: 5                  Aowyn Rozeboom

## 2017-09-16 NOTE — Progress Notes (Signed)
Per Dr. Ane Paymentoruk he would like a ct scan abd/pelvis with IV/Oral contrast only.  Routing to Principal FinancialMindy and Dr. Jena Gaussourk

## 2017-09-16 NOTE — ED Triage Notes (Signed)
Pt c/o severe right lower quadrant pain since having colonoscopy today. Pt states he has not had a good bowel movement in 7 weeks and today they diagnosed him with a stricture.

## 2017-09-16 NOTE — Telephone Encounter (Signed)
PA initiated for CT ABD/PELVIS W/ contrast. Spoke w/ Melvenia BeamShari from [employer]. This was approved from 09/16/17-10/15/17. Auth # [id number]

## 2017-09-16 NOTE — Anesthesia Preprocedure Evaluation (Signed)
Anesthesia Evaluation  Patient identified by MRN, date of birth, ID band Patient awake    Airway Mallampati: I  TM Distance: <3 FB Neck ROM: Full   Comment: Thick neck.  Dental  (+) Teeth Intact   Pulmonary Current Smoker,    Pulmonary exam normal        Cardiovascular Exercise Tolerance: Good Normal cardiovascular exam Rhythm:Regular Rate:Normal     Neuro/Psych    GI/Hepatic Neg liver ROS, Diverticulitis   Endo/Other  negative endocrine ROS  Renal/GU negative Renal ROSResults for Steven Mcbride, Steven Mcbride (MRN [id number]) as of 09/16/2017 08:16  09/12/2017 09:51 Sodium: 133 (L) Potassium: 4.0 Chloride: 98 (L) CO2: 22 Glucose: 99 BUN: 10      Musculoskeletal   Abdominal Normal abdominal exam  (+)   Peds  Hematology negative hematology ROS (+) Results for Steven Mcbride, Steven Mcbride (MRN [id number]) as of 09/16/2017 08:16  09/12/2017 09:51 Hemoglobin: 14.0 HCT: 41.7 MCV: 93.5 MCH: 31.4 MCHC: 33.6 RDW: 12.3 Platelets: 372    Anesthesia Other Findings   Reproductive/Obstetrics                             Anesthesia Physical Anesthesia Plan  ASA: II  Anesthesia Plan: MAC   Post-op Pain Management:    Induction:   PONV Risk Score and Plan: 0  Airway Management Planned: Simple Face Mask  Additional Equipment:   Intra-op Plan:   Post-operative Plan:   Informed Consent: I have reviewed the patients History and Physical, chart, labs and discussed the procedure including the risks, benefits and alternatives for the proposed anesthesia with the patient or authorized representative who has indicated his/her understanding and acceptance.   Dental advisory given  Plan Discussed with: CRNA  Anesthesia Plan Comments:         Anesthesia Quick Evaluation

## 2017-09-16 NOTE — Anesthesia Procedure Notes (Signed)
Procedure Name: MAC Date/Time: 09/16/2017 9:07 AM Performed by: Vista Deck, CRNA Pre-anesthesia Checklist: Patient identified, Emergency Drugs available, Suction available, Timeout performed and Patient being monitored Patient Re-evaluated:Patient Re-evaluated prior to induction Oxygen Delivery Method: Non-rebreather mask

## 2017-09-16 NOTE — Progress Notes (Signed)
Patient made aware of appt and stated that she picked up his contrast at the hospital today.

## 2017-09-16 NOTE — Progress Notes (Signed)
I discussed with radiology;   will be CT of the abdomen and pelvis with contrast

## 2017-09-16 NOTE — Telephone Encounter (Signed)
Patient spouse called and stated the patient is in "extreme pain" to the point of the patient is crying. She reports tylenol is not helping and she is very concerned. I spoke with Raynelle FanningJulie and since patient is in this much pain then he needs to go to the ED for eval. I advised patient spouse of this and and she will take him. Will route to Dr. Jena Gaussourk as an [place] ChildesFYI.

## 2017-09-16 NOTE — H&P (Signed)
History and Physical    Steven Mcbride WJX:[id number] DOB: 04-16-1971 DOA: 09/16/2017  PCP: Avis Epley, PA-C   Patient coming from: Home.  I have personally briefly reviewed patient's old medical records in [facility]  Chief Complaint: Abdominal pain.  HPI: Steven Mcbride is a 46 y.o. male with medical history significant of diverticulosis, episodes of diverticulitis, gout who underwent a colonoscopy this morning and mentions that shortly after the procedure was finished he felt progressively worse right lower quadrant pain associated with nausea, decreased appetite and malaise so he decided to come to the emergency department.  He originally had a colonoscopy due to constipation and difficulty with bowel movements in the last 7 weeks.  He was diagnosed today with a colonic stricture.  He complains of subjective fever and chills.  Denies dyspnea, wheezing, chest pain, palpitations, dizziness, diaphoresis, pitting edema of the lower extremities, dysuria, frequency, hematuria, polyuria, polydipsia or blurred vision.  ED Course: Initial vital signs temperature 98.46F, pulse 98, respirations 15, blood pressure 140/105 mmHg and O2 sat 99% on room air.  He did receive normal saline 500 mL NS bolus, Zofran 4 mg IVP x1, Flagyl 500 mg IVP, ciprofloxacin 500 mg IVPB and hydromorphone 1 mg IVP x1.  Case was discussed by Dr. Estell Harpin with Dr. Jena Gauss, who suggested admission and GI consult.  His workup shows normal urinalysis.  Unremarkable CMP.  However, WBC was 12.9 with 73% neutrophils, hemoglobin 13.2 g/dL platelets 213.  Imaging: CT abdomen/pelvis with contrast shows extensive inflammatory process in the pelvis involving the sigmoid colon with infiltration of the surrounding pelvic fat.  Appearance is most consistent with perforated acute diverticulitis although perforated colon cancer or inflammatory process related to colonoscopies not excluded.  There is no discrete abscess.  A  symmetric left anterior bladder wall thickening which is likely reactive to the inflammatory process in the pelvis.  Please see images and full radiology report for further detail.  Review of Systems: As per HPI otherwise 10 point review of systems negative.    Past Medical History:  Diagnosis Date  . Diverticulitis   . Gout     Past Surgical History:  Procedure Laterality Date  . None       reports that he has been smoking cigarettes.  He has a 10.00 pack-year smoking history. he has never used smokeless tobacco. He reports that he drinks alcohol. He reports that he does not use drugs.  No Known Allergies  Family History  Problem Relation Age of Onset  . Hypertension Father   . Lung cancer Father   . Colon cancer Neg Hx     Prior to Admission medications   Medication Sig Start Date End Date Taking? Authorizing Provider  acetaminophen (TYLENOL) 500 MG tablet Take 500 mg every 6 (six) hours as needed by mouth for mild pain or moderate pain.   Yes [provider]  colchicine 0.6 MG tablet TAKE 2 TABLETS AT ONSET, THEN TAKE 1 TABLET 1 HOUR LATER. TAKE 1 TABLET TWICE DAILY FOR 2 MORE DAYS AS NEEDED FOR GOUT FLARES 07/19/17  Yes [provider]  indomethacin (INDOCIN) 50 MG capsule take 50 mg by mouth three times a day if needed gout flares 07/19/17  Yes [provider]  polyethylene glycol-electrolytes (TRILYTE) 420 g solution Take 4,000 mLs by mouth as directed. Patient not taking: Reported on 09/16/2017 08/15/17   Corbin Ade, MD    Physical Exam: Vitals:   09/16/17 2028 09/16/17 2029  09/16/17 2200 09/16/17 2310  BP:  (!) 140/105 133/85 123/82  Pulse:  98 92 91  Resp:  15  17  Temp:  98.6 F (37 C)    TempSrc:  Oral    SpO2:  99% 94% 95%  Weight: 91.6 kg (202 lb)     Height: 5\' 11"  (1.803 m)       Constitutional: NAD, calm, comfortable Eyes: PERRL, lids and conjunctivae normal ENMT: Mucous membranes are moist. Posterior pharynx clear of any  exudate or lesions. Neck: normal, supple, no masses, no thyromegaly Respiratory: clear to auscultation bilaterally, no wheezing, no crackles. Normal respiratory effort. No accessory muscle use.  Cardiovascular: Regular rate and rhythm, no murmurs / rubs / gallops. No extremity edema. 2+ pedal pulses. No carotid bruits.  Abdomen: Bowel sounds positive.  Nondistended, soft, RLQ tenderness, no guarding/rebound/masses palpated. No hepatosplenomegaly.  Musculoskeletal: no clubbing / cyanosis. Good ROM, no contractures. Normal muscle tone.  Skin: There is some erythema on proximal/medial forearm area from tape of IV earlier.  Otherwise rashes, lesions, ulcers on limited skin exam. Neurologic: CN 2-12 grossly intact. Sensation intact, DTR normal. Strength 5/5 in all 4.  Psychiatric: Normal judgment and insight. Alert and oriented x 4. Normal mood.    Labs on Admission: I have personally reviewed following labs and imaging studies  CBC: Recent Labs  Lab 09/12/17 0951 09/16/17 2129  WBC 16.7* 12.9*  NEUTROABS 12.9* 9.4*  HGB 14.0 13.2  HCT 41.7 38.7*  MCV 93.5 92.1  PLT 372 362   Basic Metabolic Panel: Recent Labs  Lab 09/12/17 0951 09/16/17 2129  NA 133* 136  K 4.0 3.5  CL 98* 99*  CO2 22 23  GLUCOSE 99 94  BUN 10 10  CREATININE 0.74 0.69  CALCIUM 9.2 9.2   GFR: Estimated Creatinine Clearance: 133.5 mL/min (by C-G formula based on SCr of 0.69 mg/dL). Liver Function Tests: Recent Labs  Lab 09/16/17 2129  AST 17  ALT 34  ALKPHOS 71  BILITOT 0.4  PROT 7.3  ALBUMIN 3.5   No results for input(s): LIPASE, AMYLASE in the last 168 hours. No results for input(s): AMMONIA in the last 168 hours. Coagulation Profile: No results for input(s): INR, PROTIME in the last 168 hours. Cardiac Enzymes: No results for input(s): CKTOTAL, CKMB, CKMBINDEX, TROPONINI in the last 168 hours. BNP (last 3 results) No results for input(s): PROBNP in the last 8760 hours. HbA1C: No results for  input(s): HGBA1C in the last 72 hours. CBG: No results for input(s): GLUCAP in the last 168 hours. Lipid Profile: No results for input(s): CHOL, HDL, LDLCALC, TRIG, CHOLHDL, LDLDIRECT in the last 72 hours. Thyroid Function Tests: No results for input(s): TSH, T4TOTAL, FREET4, T3FREE, THYROIDAB in the last 72 hours. Anemia Panel: No results for input(s): VITAMINB12, FOLATE, FERRITIN, TIBC, IRON, RETICCTPCT in the last 72 hours. Urine analysis:    Component Value Date/Time   COLORURINE STRAW (A) 09/16/2017 2114   APPEARANCEUR CLEAR 09/16/2017 2114   LABSPEC 1.006 09/16/2017 2114   PHURINE 6.0 09/16/2017 2114   GLUCOSEU NEGATIVE 09/16/2017 2114   HGBUR NEGATIVE 09/16/2017 2114   BILIRUBINUR NEGATIVE 09/16/2017 2114   KETONESUR NEGATIVE 09/16/2017 2114   PROTEINUR NEGATIVE 09/16/2017 2114   UROBILINOGEN 0.2 03/11/2008 1509   NITRITE NEGATIVE 09/16/2017 2114   LEUKOCYTESUR NEGATIVE 09/16/2017 2114    Radiological Exams on Admission: Ct Abdomen Pelvis W Contrast  Result Date: 09/16/2017 CLINICAL DATA:  Severe right lower quadrant pain since colonoscopy today. No good bowel  movement in 7 weeks. Diagnosed with stricture. EXAM: CT ABDOMEN AND PELVIS WITH CONTRAST TECHNIQUE: Multidetector CT imaging of the abdomen and pelvis was performed using the standard protocol following bolus administration of intravenous contrast. CONTRAST:  ISOVUE-300 IOPAMIDOL (ISOVUE-300) INJECTION 61% COMPARISON:  None. FINDINGS: Lower chest: Atelectasis in the lung bases. Hepatobiliary: Multiple circumscribed low-attenuation lesions demonstrated throughout the liver. Largest is in the lateral segment left lobe and measures 2.2 cm diameter. Most of the other lesions are subcentimeter in size. These likely represent cysts. Gallbladder and bile ducts are unremarkable. Pancreas: Unremarkable. No pancreatic ductal dilatation or surrounding inflammatory changes. Spleen: Normal in size without focal abnormality.  Adrenals/Urinary Tract: Adrenal glands are unremarkable. Kidneys are normal, without renal calculi, focal lesion, or hydronephrosis. There is asymmetric thickening of the anterior and left superior bladder wall. This likely represents reactive inflammatory process. No intraluminal filling defects. Stomach/Bowel: There is diffuse wall thickening of the sigmoid colon with prominent fat stranding around the sigmoid colon and extending throughout the left pelvis as well as in the center anterior pelvis. Appearance is most consistent with diverticulitis although inflammatory process related to colonoscopy is not excluded. A perforated colon cancer could also be considered in the appropriate clinical setting. There is a small amount of free air demonstrated in the abdomen consistent with perforation, likely related to the sigmoid colon process. There is no evidence of proximal obstruction. No loculated collection to suggest an abscess. Stomach and small bowel are decompressed. The appendix is not identified. Vascular/Lymphatic: Aortic atherosclerosis. No enlarged abdominal or pelvic lymph nodes. Reproductive: Prostate gland is enlarged, measuring about 4.6 cm diameter. Other: No significant free fluid in the abdomen. Abdominal wall musculature appears intact. Musculoskeletal: Degenerative changes in the lumbar spine. Schmorl's node at L4. No destructive bone lesions. IMPRESSION: 1. Small amount of free intra-abdominal air consistent with bowel perforation. 2. Extensive inflammatory process in the pelvis involving the sigmoid colon with infiltration of the surrounding pelvic fat. Appearance is most consistent with perforated acute diverticulitis although perforated colon cancer or inflammatory process related to colonoscopy is not excluded. No discrete abscess. 3. Asymmetric left anterior bladder wall thickening is likely reactive to the inflammatory process in the pelvis. 4. Multiple low-attenuation lesions in the liver,  likely cysts. 5. Prostate gland is enlarged. 6. Aortic atherosclerosis. These results were called by telephone at the time of interpretation on 09/16/2017 at 10:52 pm to Dr. Bethann Berkshire , who verbally acknowledged these results. Electronically Signed   By: Burman Nieves M.D.   On: 09/16/2017 22:57    EKG: Independently reviewed.   Assessment/Plan Principal Problem:   Acute diverticulitis Admit to telemetry/inpatient. Keep n.p.o. Continue IV fluids. Analgesics as needed. Antiemetics as needed. Continue ciprofloxacin 400 mg IVPB every 12 hours. Continue metronidazole 500 mg IVPB every 8 hours. Protonix 40 mg IVP every 24 hours. GI and GS to see in consult.  Active Problems:   Colon stricture (HCC) Will be evaluated again by GI. Consult general surgery.    Gout Hold colchicine for now.    Tobacco use Continue replacement therapy ordered. Staff to provide tobacco cessation information.    DVT prophylaxis: Heparin SQ. Code Status: Full code. Family Communication: His wife was present in the room Disposition Plan: Admit for IV antibiotic therapy, symptoms management, GI and GS evaluation. Consults called: Gastroenterology and general surgery routine consult. Admission status: Inpatient/telemetry.   Bobette Mo MD [facility] Pager [phone].  If 7PM-7AM, please contact night-coverage [email or web address] Password [facility]  09/16/2017, 11:27  PM    

## 2017-09-16 NOTE — ED Provider Notes (Signed)
[facility] EMERGENCY DEPARTMENT Provider Note   CSN: [id number] Arrival date & time: 09/16/17  2007     History   Chief Complaint Chief Complaint  Patient presents with  . Abdominal Pain    HPI Abigail ButtsMichael B Tahir is a 46 y.o. male.  Patient complains of right lower quadrant pain.  Patient had colonoscopy today   The history is provided by the patient. No language interpreter was used.  Abdominal Pain   This is a recurrent problem. The current episode started 12 to 24 hours ago. The problem occurs constantly. The problem has not changed since onset.The pain is associated with an unknown factor. The pain is located in the RLQ. The quality of the pain is dull. The pain is at a severity of 6/10. The pain is moderate. Pertinent negatives include diarrhea, frequency, hematuria and headaches.    Past Medical History:  Diagnosis Date  . Diverticulitis   . Gout     Patient Active Problem List   Diagnosis Date Noted  . Acute diverticulitis 09/16/2017  . Gout 09/16/2017  . Diverticulitis 08/15/2017  . Rectal bleeding 08/15/2017    Past Surgical History:  Procedure Laterality Date  . None         Home Medications    Prior to Admission medications   Medication Sig Start Date End Date Taking? Authorizing Provider  acetaminophen (TYLENOL) 500 MG tablet Take 500 mg every 6 (six) hours as needed by mouth for mild pain or moderate pain.   Yes [provider]  colchicine 0.6 MG tablet TAKE 2 TABLETS AT ONSET, THEN TAKE 1 TABLET 1 HOUR LATER. TAKE 1 TABLET TWICE DAILY FOR 2 MORE DAYS AS NEEDED FOR GOUT FLARES 07/19/17  Yes [provider]  indomethacin (INDOCIN) 50 MG capsule take 50 mg by mouth three times a day if needed gout flares 07/19/17  Yes [provider]  polyethylene glycol-electrolytes (TRILYTE) 420 g solution Take 4,000 mLs by mouth as directed. Patient not taking: Reported on 09/16/2017 08/15/17   Rourk, Gerrit Friendsobert M, MD    Family History Family  History  Problem Relation Age of Onset  . Colon cancer Neg Hx     Social History Social History   Tobacco Use  . Smoking status: Current Every Day Smoker    Packs/day: 0.50    Years: 20.00    Pack years: 10.00    Types: Cigarettes  . Smokeless tobacco: Never Used  Substance Use Topics  . Alcohol use: Yes    Comment: 4x/week-3-4 beers, glasses of wine, or mixed drinks.  . Drug use: No     Allergies   Patient has no known allergies.   Review of Systems Review of Systems  Constitutional: Negative for appetite change and fatigue.  HENT: Negative for congestion, ear discharge and sinus pressure.   Eyes: Negative for discharge.  Respiratory: Negative for cough.   Cardiovascular: Negative for chest pain.  Gastrointestinal: Positive for abdominal pain. Negative for diarrhea.  Genitourinary: Negative for frequency and hematuria.  Musculoskeletal: Negative for back pain.  Skin: Negative for rash.  Neurological: Negative for seizures and headaches.  Psychiatric/Behavioral: Negative for hallucinations.     Physical Exam Updated Vital Signs BP 123/82   Pulse 91   Temp 98.6 F (37 C) (Oral)   Resp 17   Ht 5\' 11"  (1.803 m)   Wt 91.6 kg (202 lb)   SpO2 95%   BMI 28.17 kg/m   Physical Exam  Constitutional: He is oriented to  person, place, and time. He appears well-developed.  HENT:  Head: Normocephalic.  Eyes: Conjunctivae and EOM are normal. No scleral icterus.  Neck: Neck supple. No thyromegaly present.  Cardiovascular: Normal rate and regular rhythm. Exam reveals no gallop and no friction rub.  No murmur heard. Pulmonary/Chest: No stridor. He has no wheezes. He has no rales. He exhibits no tenderness.  Abdominal: He exhibits no distension. There is tenderness. There is no rebound.  Tender right lower quadrant  Musculoskeletal: Normal range of motion. He exhibits no edema.  Lymphadenopathy:    He has no cervical adenopathy.  Neurological: He is oriented to  person, place, and time. He exhibits normal muscle tone. Coordination normal.  Skin: No rash noted. No erythema.  Psychiatric: He has a normal mood and affect. His behavior is normal.     ED Treatments / Results  Labs (all labs ordered are listed, but only abnormal results are displayed) Labs Reviewed  CBC WITH DIFFERENTIAL/PLATELET - Abnormal; Notable for the following components:      Result Value   WBC 12.9 (*)    RBC 4.20 (*)    HCT 38.7 (*)    Neutro Abs 9.4 (*)    Monocytes Absolute 1.3 (*)    All other components within normal limits  COMPREHENSIVE METABOLIC PANEL - Abnormal; Notable for the following components:   Chloride 99 (*)    All other components within normal limits  URINALYSIS, DIPSTICK ONLY - Abnormal; Notable for the following components:   Color, Urine STRAW (*)    All other components within normal limits    EKG  EKG Interpretation None       Radiology Ct Abdomen Pelvis W Contrast  Result Date: 09/16/2017 CLINICAL DATA:  Severe right lower quadrant pain since colonoscopy today. No good bowel movement in 7 weeks. Diagnosed with stricture. EXAM: CT ABDOMEN AND PELVIS WITH CONTRAST TECHNIQUE: Multidetector CT imaging of the abdomen and pelvis was performed using the standard protocol following bolus administration of intravenous contrast. CONTRAST:  ISOVUE-300 IOPAMIDOL (ISOVUE-300) INJECTION 61% COMPARISON:  None. FINDINGS: Lower chest: Atelectasis in the lung bases. Hepatobiliary: Multiple circumscribed low-attenuation lesions demonstrated throughout the liver. Largest is in the lateral segment left lobe and measures 2.2 cm diameter. Most of the other lesions are subcentimeter in size. These likely represent cysts. Gallbladder and bile ducts are unremarkable. Pancreas: Unremarkable. No pancreatic ductal dilatation or surrounding inflammatory changes. Spleen: Normal in size without focal abnormality. Adrenals/Urinary Tract: Adrenal glands are unremarkable.  Kidneys are normal, without renal calculi, focal lesion, or hydronephrosis. There is asymmetric thickening of the anterior and left superior bladder wall. This likely represents reactive inflammatory process. No intraluminal filling defects. Stomach/Bowel: There is diffuse wall thickening of the sigmoid colon with prominent fat stranding around the sigmoid colon and extending throughout the left pelvis as well as in the center anterior pelvis. Appearance is most consistent with diverticulitis although inflammatory process related to colonoscopy is not excluded. A perforated colon cancer could also be considered in the appropriate clinical setting. There is a small amount of free air demonstrated in the abdomen consistent with perforation, likely related to the sigmoid colon process. There is no evidence of proximal obstruction. No loculated collection to suggest an abscess. Stomach and small bowel are decompressed. The appendix is not identified. Vascular/Lymphatic: Aortic atherosclerosis. No enlarged abdominal or pelvic lymph nodes. Reproductive: Prostate gland is enlarged, measuring about 4.6 cm diameter. Other: No significant free fluid in the abdomen. Abdominal wall musculature appears intact.  Musculoskeletal: Degenerative changes in the lumbar spine. Schmorl's node at L4. No destructive bone lesions. IMPRESSION: 1. Small amount of free intra-abdominal air consistent with bowel perforation. 2. Extensive inflammatory process in the pelvis involving the sigmoid colon with infiltration of the surrounding pelvic fat. Appearance is most consistent with perforated acute diverticulitis although perforated colon cancer or inflammatory process related to colonoscopy is not excluded. No discrete abscess. 3. Asymmetric left anterior bladder wall thickening is likely reactive to the inflammatory process in the pelvis. 4. Multiple low-attenuation lesions in the liver, likely cysts. 5. Prostate gland is enlarged. 6. Aortic  atherosclerosis. These results were called by telephone at the time of interpretation on 09/16/2017 at 10:52 pm to Dr. Bethann Berkshire , who verbally acknowledged these results. Electronically Signed   By: Burman Nieves M.D.   On: 09/16/2017 22:57    Procedures Procedures (including critical care time)  Medications Ordered in ED Medications  metroNIDAZOLE (FLAGYL) IVPB 500 mg (not administered)  ciprofloxacin (CIPRO) IVPB 400 mg (400 mg Intravenous New Bag/Given 09/16/17 2310)  HYDROmorphone (DILAUDID) injection 1 mg (not administered)  ondansetron (ZOFRAN) injection 4 mg (not administered)  HYDROmorphone (DILAUDID) injection 1 mg (1 mg Intravenous Given 09/16/17 2134)  ondansetron (ZOFRAN) injection 4 mg (4 mg Intravenous Given 09/16/17 2134)  sodium chloride 0.9 % bolus 500 mL (0 mLs Intravenous Stopped 09/16/17 2309)  iopamidol (ISOVUE-300) 61 % injection 100 mL (100 mLs Intravenous Contrast Given 09/16/17 2229)     Initial Impression / Assessment and Plan / ED Course  I have reviewed the triage vital signs and the nursing notes.  Pertinent labs & imaging results that were available during my care of the patient were reviewed by me and considered in my medical decision making (see chart for details).     Patient with diverticulitis and perforation of sigmoid colon.  He will be admitted to medicine with GI consult in the morning  Final Clinical Impressions(s) / ED Diagnoses   Final diagnoses:  Lower abdominal pain  Diverticulitis    ED Discharge Orders    None       Bethann Berkshire, MD 09/16/17 2325

## 2017-09-16 NOTE — Discharge Instructions (Addendum)
Low-Fiber Diet Fiber is found in fruits, vegetables, and whole grains. A low-fiber diet restricts fibrous foods that are not digested in the small intestine. A diet containing about 10-15 grams of fiber per day is considered low fiber. Low-fiber diets may be used to: Promote healing and rest the bowel during intestinal flare-ups. Prevent blockage of a partially obstructed or narrowed gastrointestinal tract. Reduce fecal weight and volume. Slow the movement of feces.  You may be on a low-fiber diet as a transitional diet following surgery, after an injury (trauma), or because of a short (acute) or lifelong (chronic) illness. Your health care provider will determine the length of time you need to stay on this diet. What do I need to know about a low-fiber diet? Always check the fiber content on the packaging's Nutrition Facts label, especially on foods from the grains list. Ask your dietitian if you have questions about specific foods that are related to your condition, especially if the food is not listed below. In general, a low-fiber food will have less than 2 g of fiber. What foods can I eat? Grains All breads and crackers made with white flour. Sweet rolls, doughnuts, waffles, pancakes, [place] toast, bagels. Pretzels, Melba toast, zwieback. Well-cooked cereals, such as cornmeal, farina, or cream cereals. Dry cereals that do not contain whole grains, fruit, or nuts, such as refined corn, wheat, rice, and oat cereals. Potatoes prepared any way without skins, plain pastas and noodles, refined white rice. Use white flour for baking and making sauces. Use allowed list of grains for casseroles, dumplings, and puddings. Vegetables Strained tomato and vegetable juices. Fresh lettuce, cucumber, spinach. Well-cooked (no skin or pulp) or canned vegetables, such as asparagus, bean sprouts, beets, carrots, green beans, mushrooms, potatoes, pumpkin, spinach, yellow squash, tomato sauce/puree, turnips, yams, and  zucchini. Keep servings limited to  cup. Fruits All fruit juices except prune juice. Cooked or canned fruits without skin and seeds, such as applesauce, apricots, cherries, fruit cocktail, grapefruit, grapes, mandarin oranges, melons, peaches, pears, pineapple, and plums. Fresh fruits without skin, such as apricots, avocados, bananas, melons, pineapple, nectarines, and peaches. Keep servings limited to  cup or 1 piece. Meat and Other Protein Sources Ground or well-cooked tender beef, ham, veal, lamb, pork, or poultry. Eggs, plain cheese. Fish, oysters, shrimp, lobster, and other seafood. Liver, organ meats. Smooth nut butters. Dairy All milk products and alternative dairy substitutes, such as soy, rice, almond, and coconut, not containing added whole nuts, seeds, or added fruit. Beverages Decaf coffee, fruit, and vegetable juices or smoothies (small amounts, with no pulp or skins, and with fruits from allowed list), sports drinks, herbal tea. Condiments Ketchup, mustard, vinegar, cream sauce, cheese sauce, cocoa powder. Spices in moderation, such as allspice, basil, bay leaves, celery powder or leaves, cinnamon, cumin powder, curry powder, ginger, mace, marjoram, onion or garlic powder, oregano, paprika, parsley flakes, ground pepper, rosemary, sage, savory, tarragon, thyme, and turmeric. Sweets and Desserts Plain cakes and cookies, pie made with allowed fruit, pudding, custard, cream pie. Gelatin, fruit, ice, sherbet, frozen ice pops. Ice cream, ice milk without nuts. Plain hard candy, honey, jelly, molasses, syrup, sugar, chocolate syrup, gumdrops, marshmallows. Limit overall sugar intake. Fats and Oil Margarine, butter, cream, mayonnaise, salad oils, plain salad dressings made from allowed foods. Choose healthy fats such as olive oil, canola oil, and omega-3 fatty acids (such as found in salmon or tuna) when possible. Other Bouillon, broth, or cream soups made from allowed foods. Any strained  soup. Casseroles or  mixed dishes made with allowed foods. The items listed above may not be a complete list of recommended foods or beverages. Contact your dietitian for more options. What foods are not recommended? Grains All whole wheat and whole grain breads and crackers. Multigrains, rye, bran seeds, nuts, or coconut. Cereals containing whole grains, multigrains, bran, coconut, nuts, raisins. Cooked or dry oatmeal, steel-cut oats. Coarse wheat cereals, granola. Cereals advertised as high fiber. Potato skins. Whole grain pasta, wild or brown rice. Popcorn. Coconut flour. Bran, buckwheat, corn bread, multigrains, rye, wheat germ. Vegetables Fresh, cooked or canned vegetables, such as artichokes, asparagus, beet greens, broccoli, Brussels sprouts, cabbage, celery, cauliflower, corn, eggplant, kale, legumes or beans, okra, peas, and tomatoes. Avoid large servings of any vegetables, especially raw vegetables. Fruits Fresh fruits, such as apples with or without skin, berries, cherries, figs, grapes, grapefruit, guavas, kiwis, mangoes, oranges, papayas, pears, persimmons, pineapple, and pomegranate. Prune juice and juices with pulp, stewed or dried prunes. Dried fruits, dates, raisins. Fruit seeds or skins. Avoid large servings of all fresh fruits. Meats and Other Protein Sources Tough, fibrous meats with gristle. Chunky nut butter. Cheese made with seeds, nuts, or other foods not recommended. Nuts, seeds, legumes (beans, including baked beans), dried peas, beans, lentils. Dairy Yogurt or cheese that contains nuts, seeds, or added fruit. Beverages Fruit juices with high pulp, prune juice. Caffeinated coffee and teas. Condiments Coconut, maple syrup, pickles, olives. Sweets and Desserts Desserts, cookies, or candies that contain nuts or coconut, chunky peanut butter, dried fruits. Jams, preserves with seeds, marmalade. Large amounts of sugar and sweets. Any other dessert made with fruits from the not  recommended list. Other Soups made from vegetables that are not recommended or that contain other foods not recommended. The items listed above may not be a complete list of foods and beverages to avoid. Contact your dietitian for more information. This information is not intended to replace advice given to you by your health care provider. Make sure you discuss any questions you have with your health care provider. Document Released: 04/20/2002 Document Revised: 04/05/2016 Document Reviewed: 09/21/2013 Elsevier Interactive Patient Education  2017 [employer] Diverticulosis Diverticulosis is a condition that develops when small pouches (diverticula) form in the wall of the large intestine (colon). The colon is where water is absorbed and stool is formed. The pouches form when the inside layer of the colon pushes through weak spots in the outer layers of the colon. You may have a few pouches or many of them. What are the causes? The cause of this condition is not known. What increases the risk? The following factors may make you more likely to develop this condition:  Being older than age 45. Your risk for this condition increases with age. Diverticulosis is rare among people younger than age 82. By age 47, many people have it.  Eating a low-fiber diet.  Having frequent constipation.  Being overweight.  Not getting enough exercise.  Smoking.  Taking over-the-counter pain medicines, like aspirin and ibuprofen.  Having a family history of diverticulosis.  What are the signs or symptoms? In most people, there are no symptoms of this condition. If you do have symptoms, they may include:  Bloating.  Cramps in the abdomen.  Constipation or diarrhea.  Pain in the lower left side of the abdomen.  How is this diagnosed? This condition is most often diagnosed during an exam for other colon problems. Because diverticulosis usually has no symptoms, it often cannot be diagnosed  independently. This  condition may be diagnosed by:  Using a flexible scope to examine the colon (colonoscopy).  Taking an X-ray of the colon after dye has been put into the colon (barium enema).  Doing a CT scan.  How is this treated? You may not need treatment for this condition if you have never developed an infection related to diverticulosis. If you have had an infection before, treatment may include:  Eating a high-fiber diet. This may include eating more fruits, vegetables, and grains.  Taking a fiber supplement.  Taking a live bacteria supplement (probiotic).  Taking medicine to relax your colon.  Taking antibiotic medicines.  Follow these instructions at home:  Drink 6-8 glasses of water or more each day to prevent constipation.  Try not to strain when you have a bowel movement.  If you have had an infection before: ? Eat more fiber as directed by your health care provider or your diet and nutrition specialist (dietitian). ? Take a fiber supplement or probiotic, if your health care provider approves.  Take over-the-counter and prescription medicines only as told by your health care provider.  If you were prescribed an antibiotic, take it as told by your health care provider. Do not stop taking the antibiotic even if you start to feel better.  Keep all follow-up visits as told by your health care provider. This is important. Contact a health care provider if:  You have pain in your abdomen.  You have bloating.  You have cramps.  You have not had a bowel movement in 3 days. Get help right away if:  Your pain gets worse.  Your bloating becomes very bad.  You have a fever or chills, and your symptoms suddenly get worse.  You vomit.  You have bowel movements that are bloody or black.  You have bleeding from your rectum. Summary  Diverticulosis is a condition that develops when small pouches (diverticula) form in the wall of the large intestine  (colon).  You may have a few pouches or many of them.  This condition is most often diagnosed during an exam for other colon problems.  If you have had an infection related to diverticulosis, treatment may include increasing the fiber in your diet, taking supplements, or taking medicines. This information is not intended to replace advice given to you by your health care provider. Make sure you discuss any questions you have with your health care provider. Document Released: 07/26/2004 Document Revised: 09/17/2016 Document Reviewed: 09/17/2016 Elsevier Interactive Patient Education  2017 [employer] Inc[employer]  Colonoscopy Discharge Instructions  Read the instructions outlined below and refer to this sheet in the next few weeks. These discharge instructions provide you with general information on caring for yourself after you leave the hospital. Your doctor may also give you specific instructions. While your treatment has been planned according to the most current medical practices available, unavoidable complications occasionally occur. If you have any problems or questions after discharge, call Dr. Jena Gauss at [phone]. ACTIVITY  You may resume your regular activity, but move at a slower pace for the next 24 hours.   Take frequent rest periods for the next 24 hours.   Walking will help get rid of the air and reduce the bloated feeling in your belly (abdomen).   No driving for 24 hours (because of the medicine (anesthesia) used during the test).    Do not sign any important legal documents or operate any machinery for 24 hours (because of the anesthesia used during the test).  NUTRITION  Drink plenty of fluids.   You may resume your normal diet as instructed by your doctor.   Begin with a light meal and progress to your normal diet. Heavy or fried foods are harder to digest and may make you feel sick to your stomach (nauseated).   Avoid alcoholic beverages for 24 hours or as instructed.   MEDICATIONS  You may resume your normal medications unless your doctor tells you otherwise.  WHAT YOU CAN EXPECT TODAY  Some feelings of bloating in the abdomen.   Passage of more gas than usual.   Spotting of blood in your stool or on the toilet paper.  IF YOU HAD POLYPS REMOVED DURING THE COLONOSCOPY:  No aspirin products for 7 days or as instructed.   No alcohol for 7 days or as instructed.   Eat a soft diet for the next 24 hours.  FINDING OUT THE RESULTS OF YOUR TEST Not all test results are available during your visit. If your test results are not back during the visit, make an appointment with your caregiver to find out the results. Do not assume everything is normal if you have not heard from your caregiver or the medical facility. It is important for you to follow up on all of your test results.  SEEK IMMEDIATE MEDICAL ATTENTION IF:  You have more than a spotting of blood in your stool.   Your belly is swollen (abdominal distention).   You are nauseated or vomiting.   You have a temperature over 101.   You have abdominal pain or discomfort that is severe or gets worse throughout the day.    Colon Diverticulosis information provided  You have what appears to be benign inflammatory stricture in your colon. You need to see a surgeon to have this narrowed segment removed  CT of the abdomen and pelvis with and without contrast to further evaluate his abnormal proximal sigmoid colon  Further recommendations to follow pending review of pathology report  Low residue diet

## 2017-09-16 NOTE — H&P (Signed)
 @LOGO @   Primary Care Physician:  Ladon ApplebaumJackson, Samantha J, PA-C Primary Gastroenterologist:  Dr. Jena Gaussourk  Pre-Procedure History & Physical: HPI:  Steven Mcbride is a 46 y.o. male here for rectal bleeding. Recurrent left lower quadrant abdominal pain. Treated clinically for diverticulitis over the years. One CT back in 2004 demonstrated a picture more consistent with colitis. He does not have diarrhea anything, tensing towards constipation chronically. Bleeding and not Splayed Do Not Go Together.  Past Medical History:  Diagnosis Date  . Diverticulitis   . Gout     Past Surgical History:  Procedure Laterality Date  . None      Prior to Admission medications   Medication Sig Start Date End Date Taking? Authorizing Provider  Aspirin-Salicylamide-Caffeine (BC HEADACHE POWDER PO) Take 1 packet by mouth as needed (headaches).    Yes [provider]  colchicine 0.6 MG tablet TAKE 2 TABLETS AT ONSET, THEN TAKE 1 TABLET 1 HOUR LATER. TAKE 1 TABLET TWICE DAILY FOR 2 MORE DAYS AS NEEDED FOR GOUT FLARES 07/19/17  Yes [provider]  cyclobenzaprine (FLEXERIL) 10 MG tablet Take 10 mg by mouth at bedtime as needed for muscle spasms. 07/19/17  Yes [provider]  fluticasone (FLONASE) 50 MCG/ACT nasal spray Place 1 spray into both nostrils daily. 07/19/17  Yes [provider]  indomethacin (INDOCIN) 50 MG capsule take 50 mg by mouth three times a day if needed gout flares 07/19/17  Yes [provider]  Naphazoline HCl (CLEAR EYES OP) Apply 1 drop to eye daily as needed (dry eyes).   Yes [provider]  naproxen sodium (ANAPROX) 220 MG tablet Take 440 mg by mouth as needed (knee pain).    Yes [provider]  polyethylene glycol-electrolytes (TRILYTE) 420 g solution Take 4,000 mLs by mouth as directed. 08/15/17   Corbin Adeourk, Sylvester Salonga M, MD    Allergies as of 08/15/2017  . (No Known Allergies)    Family History  Problem Relation Age of Onset  .  Colon cancer Neg Hx     Social History   Socioeconomic History  . Marital status: Married    Spouse name: Not on file  . Number of children: Not on file  . Years of education: Not on file  . Highest education level: Not on file  Social Needs  . Financial resource strain: Not on file  . Food insecurity - worry: Not on file  . Food insecurity - inability: Not on file  . Transportation needs - medical: Not on file  . Transportation needs - non-medical: Not on file  Occupational History  . Not on file  Tobacco Use  . Smoking status: Current Every Day Smoker    Packs/day: 0.50    Years: 20.00    Pack years: 10.00    Types: Cigarettes  . Smokeless tobacco: Never Used  Substance and Sexual Activity  . Alcohol use: Yes    Comment: 4x/week-3-4 beers, glasses of wine, or mixed drinks.  . Drug use: No  . Sexual activity: Yes    Birth control/protection: None  Other Topics Concern  . Not on file  Social History Narrative  . Not on file    Review of Systems: See HPI, otherwise negative ROS  Physical Exam: BP (!) 149/101   Temp 98.7 F (37.1 C) (Oral)   Resp 20   SpO2 96%  General:   Alert,  Well-developed, well-nourished, pleasant and cooperative in NAD Neck:  Supple; no masses or thyromegaly. No significant  cervical adenopathy. Lungs:  Clear throughout to auscultation.   No wheezes, crackles, or rhonchi. No acute distress. Heart:  Regular rate and rhythm; no murmurs, clicks, rubs,  or gallops. Abdomen: Non-distended, normal bowel sounds.  Soft and nontender without appreciable mass or hepatosplenomegaly.  Pulses:  Normal pulses noted. Extremities:  Without clubbing or edema.  Impression:  Recurrent rectal bleeding occasionally left lower quadrant abdominal pain. Reported history of diverticulitis. Patient needs further evaluation of rectal bleeding.  Recommendations:  I have offered the patient a diagnostic colonoscopy today.  The risks, benefits, limitations,  alternatives and imponderables have been reviewed with the patient. Questions have been answered. All parties are agreeable.                             Notice: This dictation was prepared with Dragon dictation along with smaller phrase technology. Any transcriptional errors that result from this process are unintentional and may not be corrected upon review.

## 2017-09-16 NOTE — ED Notes (Signed)
Pt says pain is coming and going now, pain goes up to 8 of 10; Dr Estell HarpinZammit made aware- new orders received.

## 2017-09-16 NOTE — Op Note (Addendum)
[facility] Patient Name: Steven Mcbride Procedure Date: 09/16/2017 8:48 AM MRN: [id number] Date of Birth: [birth year] Attending MD: Gennette Pac , MD CSN: [id number] Age: 46 Admit Type: Outpatient Procedure:                Colonoscopy Indications:              Hematochezia, Change in bowel habits Providers:                Gennette Pac, MD, Nena Polio, RN, Edrick Kins, RN Referring MD:              Medicines:                Propofol per Anesthesia Complications:            No immediate complications. Estimated Blood Loss:     Estimated blood loss was minimal. Procedure:                Pre-Anesthesia Assessment:                           - Prior to the procedure, a History and Physical                            was performed, and patient medications and                            allergies were reviewed. The patient's tolerance of                            previous anesthesia was also reviewed. The risks                            and benefits of the procedure and the sedation                            options and risks were discussed with the patient.                            All questions were answered, and informed consent                            was obtained. Prior Anticoagulants: The patient has                            taken no previous anticoagulant or antiplatelet                            agents. ASA Grade Assessment: II - A patient with                            mild systemic disease. After reviewing the risks  and benefits, the patient was deemed in                            satisfactory condition to undergo the procedure.                           After obtaining informed consent, the colonoscope                            was passed under direct vision. Throughout the                            procedure, the patient's blood pressure, pulse, and                            oxygen  saturations were monitored continuously. The                            EC-[id number] ([id number]) scope was introduced through                            the and advanced to the the cecum, identified by                            appendiceal orifice and ileocecal valve. The                            ileocecal valve, appendiceal orifice, and rectum                            were photographed. The entire colon was not well                            visualized. Anatomical landmarks were photographed.                            The colonoscopy was performed with moderate                            difficulty due to inadequate bowel prep. The                            patient tolerated the procedure well. The quality                            of the bowel preparation was inadequate. Scope In: 9:22:37 AM Scope Out: 10:18:48 AM Scope Withdrawal Time: 0 hours 22 minutes 41 seconds  Total Procedure Duration: 0 hours 56 minutes 11 seconds  Findings:      The perianal and digital rectal examinations were normal.      Scattered small and large-mouthed diverticula were found in the sigmoid       colon and descending colon.      A benign-appearing, intrinsic severe stenosis was found in the sigmoid       colon. Chronically  inflamed appearing, fibrotic appearing, hypertrophied       mucosa involving a 15 cm segment of the proximal sigmoid colon. This as       producing significant narrowing in the colon. I was able to negotiate       the scope through it. Upstream the colon w bit of viscous; formed and       liquid stool which precluded complete evaluation of all the colonic       mucosa. I did make it to the cecum. From the cecum and back no gross       lesions were seen however a small or flat lesion could have been       obscured. The mucosa involving the abnormal sigmoid sebiopsied..       Verification of patient identification for the specimen was done.       Estimated blood loss was  minimal. Impression:               - Diverticulosis in the sigmoid colon and in the                            descending colon.                           - Stricture in the sigmoid colon. Biopsied. Appears                            benign. Likely inflammatory; coexisting ischemic                            component not excluded. Moderate Sedation:      Moderate (conscious) sedation was personally administered by an       anesthesia professional. The following parameters were monitored: oxygen       saturation, heart rate, blood pressure, respiratory rate, EKG, adequacy       of pulmonary ventilation, and response to care. Total physician       intraservice time was 68 minutes. Recommendation:           - Return to normal activities tomorrow. update CT                            scan abdomen and pelvis. I recommend the patient                            see a surgeon for segmental resection of the                            diseased segment pending review of biopsies.                           - Low fiber diet.                           - Continue present medications.                           - Repeat colonoscopy in 6 months for screening  purposes.                           - Return to GI clinic (date not yet determined).                           - Patient has a contact number available for                            emergencies. The signs and symptoms of potential                            delayed complications were discussed with the                            patient. Return to normal activities tomorrow.                            Written discharge instructions were provided to the                            patient. Procedure Code(s):        --- Professional ---                           [phone], Colonoscopy, flexible; with biopsy, single                            or multiple Diagnosis Code(s):        --- Professional ---                            K56.69, Other intestinal obstruction                           K92.1, Melena (includes Hematochezia)                           R19.4, Change in bowel habit                           K57.30, Diverticulosis of large intestine without                            perforation or abscess without bleeding CPT copyright 2016 [employer]. All rights reserved. The codes documented in this report are preliminary and upon coder review may  be revised to meet current compliance requirements. Gerrit Friends. Zakhia Seres, MD Gennette Pac, MD 09/16/2017 10:33:58 AM This report has been signed electronically. Number of Addenda: 0

## 2017-09-16 NOTE — Transfer of Care (Signed)
Immediate Anesthesia Transfer of Care Note  Patient: Steven Mcbride  Procedure(s) Performed: COLONOSCOPY WITH PROPOFOL (N/A ) BIOPSY  Patient Location: PACU  Anesthesia Type:MAC  Level of Consciousness: awake and alert   Airway & Oxygen Therapy: Patient Spontanous Breathing and Patient connected to nasal cannula oxygen  Post-op Assessment: Report given to RN and Post -op Vital signs reviewed and stable  Post vital signs: Reviewed and stable  Last Vitals:  Vitals:   09/16/17 0900 09/16/17 0905  BP: (!) 152/101 (!) 153/101  Resp: 19 (!) 23  Temp:    SpO2: 97% 96%    Last Pain:  Vitals:   09/16/17 0908  TempSrc:   PainSc: 8       Patients Stated Pain Goal: 6 (40/81/44 8185)  Complications: No apparent anesthesia complications

## 2017-09-16 NOTE — Progress Notes (Signed)
I verified with Raynelle FanningJulie in Radiology that the order is in Epic correctly

## 2017-09-16 NOTE — Progress Notes (Signed)
CT abd/pelvis scheduled for 09/19/17 at 12pm. Arrival time 11:45am. NPO 4 hrs prior to procedure. [facility] to [facility] up contrast. LMOVM

## 2017-09-17 ENCOUNTER — Other Ambulatory Visit: Payer: Self-pay

## 2017-09-17 ENCOUNTER — Encounter (HOSPITAL_COMMUNITY)
Admission: EM | Disposition: A | Discharge status: Home Health | Payer: Self-pay | Source: Home / Self Care | Attending: General Surgery

## 2017-09-17 ENCOUNTER — Encounter (HOSPITAL_COMMUNITY): Payer: Self-pay

## 2017-09-17 ENCOUNTER — Inpatient Hospital Stay (HOSPITAL_COMMUNITY): Payer: BLUE CROSS/BLUE SHIELD | Admitting: Anesthesiology

## 2017-09-17 DIAGNOSIS — K56699 Other intestinal obstruction unspecified as to partial versus complete obstruction: Secondary | ICD-10-CM

## 2017-09-17 HISTORY — PX: PARTIAL COLECTOMY: SHX5273

## 2017-09-17 HISTORY — PX: COLOSTOMY: SHX63

## 2017-09-17 LAB — CBC WITH DIFFERENTIAL/PLATELET
BASOS ABS: 0.1 10*3/uL (ref 0.0–0.1)
BASOS PCT: 0 %
EOS PCT: 1 %
Eosinophils Absolute: 0.1 10*3/uL (ref 0.0–0.7)
HCT: 35.4 % — ABNORMAL LOW (ref 39.0–52.0)
Hemoglobin: 11.5 g/dL — ABNORMAL LOW (ref 13.0–17.0)
LYMPHS PCT: 12 %
Lymphs Abs: 1.6 10*3/uL (ref 0.7–4.0)
MCH: 30.5 pg (ref 26.0–34.0)
MCHC: 32.5 g/dL (ref 30.0–36.0)
MCV: 93.9 fL (ref 78.0–100.0)
Monocytes Absolute: 1.5 10*3/uL — ABNORMAL HIGH (ref 0.1–1.0)
Monocytes Relative: 11 %
NEUTROS ABS: 10.2 10*3/uL — AB (ref 1.7–7.7)
Neutrophils Relative %: 76 %
PLATELETS: 350 10*3/uL (ref 150–400)
RBC: 3.77 MIL/uL — AB (ref 4.22–5.81)
RDW: 12.1 % (ref 11.5–15.5)
WBC: 13.4 10*3/uL — AB (ref 4.0–10.5)

## 2017-09-17 SURGERY — COLECTOMY, PARTIAL
Anesthesia: General | Site: Abdomen

## 2017-09-17 MED ORDER — ONDANSETRON 4 MG PO TBDP: 4.0000 mg | ORAL_TABLET | Freq: Four times a day (QID) | ORAL | Status: DC | PRN

## 2017-09-17 MED ORDER — ONDANSETRON HCL 4 MG PO TABS: 4.0000 mg | ORAL_TABLET | Freq: Four times a day (QID) | ORAL | Status: DC | PRN

## 2017-09-17 MED ORDER — LACTATED RINGERS IV SOLN
INTRAVENOUS | Status: DC
  Administered 2017-09-17 (×2): via INTRAVENOUS
  Administered 2017-09-17: 1000 mL via INTRAVENOUS
  Administered 2017-09-17: 14:00:00 via INTRAVENOUS

## 2017-09-17 MED ORDER — SODIUM CHLORIDE 0.9 % IV SOLN
1.0000 g | INTRAVENOUS | Status: AC
  Administered 2017-09-18 – 2017-09-19 (×2): 1 g via INTRAVENOUS
  Filled 2017-09-17 (×2): qty 1

## 2017-09-17 MED ORDER — KETOROLAC TROMETHAMINE 30 MG/ML IJ SOLN
30.0000 mg | Freq: Four times a day (QID) | INTRAMUSCULAR | Status: AC
  Administered 2017-09-17 (×2): 30 mg via INTRAVENOUS
  Filled 2017-09-17 (×2): qty 1

## 2017-09-17 MED ORDER — HEPARIN SODIUM (PORCINE) 5000 UNIT/ML IJ SOLN
5000.0000 [IU] | Freq: Three times a day (TID) | INTRAMUSCULAR | Status: DC
  Administered 2017-09-17: 5000 [IU] via SUBCUTANEOUS
  Filled 2017-09-17: qty 1

## 2017-09-17 MED ORDER — ONDANSETRON 4 MG PO TBDP
4.0000 mg | ORAL_TABLET | Freq: Once | ORAL | Status: AC
  Administered 2017-09-17: 4 mg via ORAL

## 2017-09-17 MED ORDER — SUCCINYLCHOLINE CHLORIDE 20 MG/ML IJ SOLN
INTRAMUSCULAR | Status: DC | PRN
  Administered 2017-09-17: 140 mg via INTRAVENOUS

## 2017-09-17 MED ORDER — ACETAMINOPHEN 500 MG PO TABS
1000.0000 mg | ORAL_TABLET | Freq: Four times a day (QID) | ORAL | Status: DC
  Administered 2017-09-17 – 2017-09-22 (×15): 1000 mg via ORAL
  Filled 2017-09-17 (×20): qty 2

## 2017-09-17 MED ORDER — PROMETHAZINE HCL 25 MG/ML IJ SOLN
INTRAMUSCULAR | Status: AC
  Filled 2017-09-17: qty 1

## 2017-09-17 MED ORDER — SODIUM CHLORIDE 0.9 % IV SOLN
1.0000 g | INTRAVENOUS | Status: AC
  Administered 2017-09-17: 1 g via INTRAVENOUS
  Filled 2017-09-17 (×2): qty 1

## 2017-09-17 MED ORDER — DIPHENHYDRAMINE HCL 50 MG/ML IJ SOLN: 12.5000 mg | Freq: Four times a day (QID) | INTRAMUSCULAR | Status: DC | PRN

## 2017-09-17 MED ORDER — INFLUENZA VAC SPLIT QUAD 0.5 ML IM SUSY
0.5000 mL | PREFILLED_SYRINGE | INTRAMUSCULAR | Status: AC
  Administered 2017-09-18: 0.5 mL via INTRAMUSCULAR
  Filled 2017-09-17: qty 0.5

## 2017-09-17 MED ORDER — PROPOFOL 10 MG/ML IV BOLUS
INTRAVENOUS | Status: DC | PRN
  Administered 2017-09-17: 20 mg via INTRAVENOUS
  Administered 2017-09-17: 150 mg via INTRAVENOUS
  Administered 2017-09-17: 30 mg via INTRAVENOUS

## 2017-09-17 MED ORDER — DOCUSATE SODIUM 100 MG PO CAPS
100.0000 mg | ORAL_CAPSULE | Freq: Two times a day (BID) | ORAL | Status: DC
  Administered 2017-09-17 – 2017-09-24 (×14): 100 mg via ORAL
  Filled 2017-09-17 (×14): qty 1

## 2017-09-17 MED ORDER — DIPHENHYDRAMINE HCL 12.5 MG/5ML PO ELIX: 12.5000 mg | ORAL_SOLUTION | Freq: Four times a day (QID) | ORAL | Status: DC | PRN

## 2017-09-17 MED ORDER — MIDAZOLAM HCL 2 MG/2ML IJ SOLN
1.0000 mg | Freq: Once | INTRAMUSCULAR | Status: AC | PRN
  Administered 2017-09-17: 2 mg via INTRAVENOUS

## 2017-09-17 MED ORDER — LACTATED RINGERS IV SOLN
INTRAVENOUS | Status: DC
  Administered 2017-09-17 – 2017-09-18 (×2): via INTRAVENOUS

## 2017-09-17 MED ORDER — 0.9 % SODIUM CHLORIDE (POUR BTL) OPTIME
TOPICAL | Status: DC | PRN
  Administered 2017-09-17 (×2): 2000 mL

## 2017-09-17 MED ORDER — HYDROMORPHONE HCL 1 MG/ML IJ SOLN
1.0000 mg | INTRAMUSCULAR | Status: DC | PRN
  Administered 2017-09-17 (×4): 1 mg via INTRAVENOUS
  Filled 2017-09-17 (×4): qty 1

## 2017-09-17 MED ORDER — HYDROMORPHONE HCL 1 MG/ML IJ SOLN
INTRAMUSCULAR | Status: AC
  Filled 2017-09-17: qty 1

## 2017-09-17 MED ORDER — CEFOTETAN DISODIUM-DEXTROSE 2-2.08 GM-%(50ML) IV SOLR
INTRAVENOUS | Status: AC
  Filled 2017-09-17: qty 50

## 2017-09-17 MED ORDER — OXYCODONE HCL 5 MG PO TABS
5.0000 mg | ORAL_TABLET | ORAL | Status: DC | PRN
  Administered 2017-09-17 – 2017-09-21 (×16): 10 mg via ORAL
  Administered 2017-09-21: 5 mg via ORAL
  Administered 2017-09-21 (×2): 10 mg via ORAL
  Administered 2017-09-21: 5 mg via ORAL
  Administered 2017-09-22 – 2017-09-24 (×8): 10 mg via ORAL
  Filled 2017-09-17 (×8): qty 2
  Filled 2017-09-17: qty 1
  Filled 2017-09-17 (×4): qty 2
  Filled 2017-09-17: qty 1
  Filled 2017-09-17 (×5): qty 2
  Filled 2017-09-17: qty 1
  Filled 2017-09-17 (×4): qty 2
  Filled 2017-09-17: qty 1
  Filled 2017-09-17 (×4): qty 2

## 2017-09-17 MED ORDER — ONDANSETRON 4 MG PO TBDP
ORAL_TABLET | ORAL | Status: AC
  Filled 2017-09-17: qty 1

## 2017-09-17 MED ORDER — METRONIDAZOLE IN NACL 5-0.79 MG/ML-% IV SOLN
500.0000 mg | Freq: Three times a day (TID) | INTRAVENOUS | Status: DC
  Administered 2017-09-17 (×2): 500 mg via INTRAVENOUS
  Filled 2017-09-17 (×3): qty 100

## 2017-09-17 MED ORDER — SODIUM CHLORIDE 0.9 % IJ SOLN
INTRAMUSCULAR | Status: AC
  Filled 2017-09-17: qty 10

## 2017-09-17 MED ORDER — GLYCOPYRROLATE 0.2 MG/ML IJ SOLN
INTRAMUSCULAR | Status: DC | PRN
  Administered 2017-09-17: .5 mg via INTRAVENOUS

## 2017-09-17 MED ORDER — ACETAMINOPHEN 10 MG/ML IV SOLN
1000.0000 mg | Freq: Once | INTRAVENOUS | Status: AC
  Administered 2017-09-17: 1000 mg via INTRAVENOUS

## 2017-09-17 MED ORDER — PROPOFOL 10 MG/ML IV BOLUS
INTRAVENOUS | Status: AC
  Filled 2017-09-17: qty 20

## 2017-09-17 MED ORDER — SUFENTANIL CITRATE 50 MCG/ML IV SOLN
INTRAVENOUS | Status: AC
  Filled 2017-09-17: qty 1

## 2017-09-17 MED ORDER — PANTOPRAZOLE SODIUM 40 MG IV SOLR
40.0000 mg | INTRAVENOUS | Status: DC
  Administered 2017-09-17 – 2017-09-22 (×7): 40 mg via INTRAVENOUS
  Filled 2017-09-17 (×8): qty 40

## 2017-09-17 MED ORDER — GLYCOPYRROLATE 0.2 MG/ML IJ SOLN
INTRAMUSCULAR | Status: AC
  Filled 2017-09-17: qty 3

## 2017-09-17 MED ORDER — LIDOCAINE HCL (CARDIAC) 10 MG/ML IV SOLN
INTRAVENOUS | Status: DC | PRN
  Administered 2017-09-17: 50 mg via INTRAVENOUS

## 2017-09-17 MED ORDER — BUPIVACAINE LIPOSOME 1.3 % IJ SUSP
INTRAMUSCULAR | Status: AC
  Filled 2017-09-17: qty 20

## 2017-09-17 MED ORDER — MIDAZOLAM HCL 2 MG/2ML IJ SOLN
INTRAMUSCULAR | Status: AC
  Filled 2017-09-17: qty 2

## 2017-09-17 MED ORDER — KETOROLAC TROMETHAMINE 30 MG/ML IJ SOLN
30.0000 mg | Freq: Once | INTRAMUSCULAR | Status: AC
  Administered 2017-09-17: 30 mg via INTRAVENOUS
  Filled 2017-09-17: qty 1

## 2017-09-17 MED ORDER — ONDANSETRON HCL 4 MG/2ML IJ SOLN
4.0000 mg | Freq: Four times a day (QID) | INTRAMUSCULAR | Status: DC | PRN
  Filled 2017-09-17: qty 2

## 2017-09-17 MED ORDER — POVIDONE-IODINE 10 % EX OINT
TOPICAL_OINTMENT | CUTANEOUS | Status: AC
  Filled 2017-09-17: qty 1

## 2017-09-17 MED ORDER — POTASSIUM CHLORIDE IN NACL 20-0.9 MEQ/L-% IV SOLN
INTRAVENOUS | Status: DC
  Administered 2017-09-17: 03:00:00 via INTRAVENOUS

## 2017-09-17 MED ORDER — ACETAMINOPHEN 10 MG/ML IV SOLN
INTRAVENOUS | Status: AC
  Filled 2017-09-17: qty 100

## 2017-09-17 MED ORDER — CIPROFLOXACIN IN D5W 400 MG/200ML IV SOLN
400.0000 mg | Freq: Two times a day (BID) | INTRAVENOUS | Status: DC
  Administered 2017-09-17 (×2): 400 mg via INTRAVENOUS
  Filled 2017-09-17 (×2): qty 200

## 2017-09-17 MED ORDER — ONDANSETRON HCL 4 MG/2ML IJ SOLN
4.0000 mg | Freq: Four times a day (QID) | INTRAMUSCULAR | Status: DC | PRN
  Administered 2017-09-18 – 2017-09-19 (×2): 4 mg via INTRAVENOUS
  Filled 2017-09-17 (×2): qty 2

## 2017-09-17 MED ORDER — SUFENTANIL CITRATE 50 MCG/ML IV SOLN
INTRAVENOUS | Status: DC | PRN
  Administered 2017-09-17 (×2): 5 ug via INTRAVENOUS
  Administered 2017-09-17: 10 ug via INTRAVENOUS
  Administered 2017-09-17 (×3): 5 ug via INTRAVENOUS
  Administered 2017-09-17 (×3): 10 ug via INTRAVENOUS
  Administered 2017-09-17: 5 ug via INTRAVENOUS
  Administered 2017-09-17: 10 ug via INTRAVENOUS
  Administered 2017-09-17: 5 ug via INTRAVENOUS

## 2017-09-17 MED ORDER — MORPHINE SULFATE (PF) 2 MG/ML IV SOLN
2.0000 mg | INTRAVENOUS | Status: DC | PRN
  Administered 2017-09-17 – 2017-09-21 (×28): 2 mg via INTRAVENOUS
  Filled 2017-09-17 (×28): qty 1

## 2017-09-17 MED ORDER — SIMETHICONE 80 MG PO CHEW
40.0000 mg | CHEWABLE_TABLET | Freq: Four times a day (QID) | ORAL | Status: DC | PRN
  Administered 2017-09-18 – 2017-09-19 (×2): 40 mg via ORAL
  Filled 2017-09-17 (×2): qty 1

## 2017-09-17 MED ORDER — DEXTROSE 5 % IV SOLN
2.0000 g | INTRAVENOUS | Status: AC
  Administered 2017-09-17: 2 g via INTRAVENOUS
  Filled 2017-09-17: qty 2

## 2017-09-17 MED ORDER — IBUPROFEN 600 MG PO TABS
600.0000 mg | ORAL_TABLET | Freq: Four times a day (QID) | ORAL | Status: DC | PRN
  Administered 2017-09-18 (×2): 600 mg via ORAL
  Filled 2017-09-17 (×2): qty 1

## 2017-09-17 MED ORDER — ROCURONIUM BROMIDE 100 MG/10ML IV SOLN
INTRAVENOUS | Status: DC | PRN
  Administered 2017-09-17: 10 mg via INTRAVENOUS
  Administered 2017-09-17: 5 mg via INTRAVENOUS
  Administered 2017-09-17: 10 mg via INTRAVENOUS
  Administered 2017-09-17: 20 mg via INTRAVENOUS
  Administered 2017-09-17: 25 mg via INTRAVENOUS

## 2017-09-17 MED ORDER — HYDROMORPHONE HCL 1 MG/ML IJ SOLN
1.0000 mg | Freq: Once | INTRAMUSCULAR | Status: AC
  Administered 2017-09-17: 1 mg via INTRAVENOUS
  Filled 2017-09-17: qty 1

## 2017-09-17 MED ORDER — HYDROMORPHONE HCL 1 MG/ML IJ SOLN
0.2500 mg | INTRAMUSCULAR | Status: DC | PRN
  Administered 2017-09-17 (×2): 0.5 mg via INTRAVENOUS

## 2017-09-17 MED ORDER — CHLORHEXIDINE GLUCONATE CLOTH 2 % EX PADS
6.0000 | MEDICATED_PAD | Freq: Once | CUTANEOUS | Status: AC
  Administered 2017-09-17: 6 via TOPICAL

## 2017-09-17 MED ORDER — HYDROMORPHONE HCL 1 MG/ML IJ SOLN: 0.2500 mg | INTRAMUSCULAR | Status: DC | PRN

## 2017-09-17 MED ORDER — ENOXAPARIN SODIUM 40 MG/0.4ML ~~LOC~~ SOLN
40.0000 mg | SUBCUTANEOUS | Status: DC
  Administered 2017-09-18 – 2017-09-24 (×6): 40 mg via SUBCUTANEOUS
  Filled 2017-09-17 (×7): qty 0.4

## 2017-09-17 MED ORDER — NEOSTIGMINE METHYLSULFATE 10 MG/10ML IV SOLN
INTRAVENOUS | Status: DC | PRN
  Administered 2017-09-17: 3 mg via INTRAVENOUS

## 2017-09-17 MED ORDER — ONDANSETRON HCL 4 MG/2ML IJ SOLN
INTRAMUSCULAR | Status: DC | PRN
  Administered 2017-09-17: 4 mg via INTRAVENOUS

## 2017-09-17 MED ORDER — NICOTINE 21 MG/24HR TD PT24
21.0000 mg | MEDICATED_PATCH | Freq: Every day | TRANSDERMAL | Status: DC
  Administered 2017-09-17 – 2017-09-24 (×9): 21 mg via TRANSDERMAL
  Filled 2017-09-17 (×8): qty 1

## 2017-09-17 MED ORDER — PROMETHAZINE HCL 25 MG/ML IJ SOLN
6.2500 mg | Freq: Once | INTRAMUSCULAR | Status: AC
  Administered 2017-09-17: 6.25 mg via INTRAVENOUS

## 2017-09-17 SURGICAL SUPPLY — 62 items
BAG HAMPER (MISCELLANEOUS) ×2 IMPLANT
BARRIER SKIN 2 3/4 (OSTOMY) IMPLANT
BARRIER SKIN OD2.25 2 3/4 FLNG (OSTOMY) IMPLANT
BRR SKN FLT 2.75X2.25 2 PC (OSTOMY)
CHLORAPREP W/TINT 26ML (MISCELLANEOUS) ×2 IMPLANT
CLOTH BEACON ORANGE TIMEOUT ST (SAFETY) ×2 IMPLANT
COVER LIGHT HANDLE STERIS (MISCELLANEOUS) ×4 IMPLANT
COVER MAYO STAND XLG (DRAPE) ×2 IMPLANT
DRAPE UTILITY W/TAPE 26X15 (DRAPES) ×4 IMPLANT
DRAPE WARM FLUID 44X44 (DRAPE) ×2 IMPLANT
DRSG OPSITE POSTOP 4X8 (GAUZE/BANDAGES/DRESSINGS) ×1 IMPLANT
ELECT BLADE 6 FLAT ULTRCLN (ELECTRODE) ×1 IMPLANT
ELECT REM PT RETURN 9FT ADLT (ELECTROSURGICAL) ×2
ELECTRODE REM PT RTRN 9FT ADLT (ELECTROSURGICAL) ×1 IMPLANT
GLOVE BIO SURGEON STRL SZ 6.5 (GLOVE) ×4 IMPLANT
GLOVE BIOGEL PI IND STRL 6.5 (GLOVE) ×1 IMPLANT
GLOVE BIOGEL PI IND STRL 7.0 (GLOVE) ×4 IMPLANT
GLOVE BIOGEL PI INDICATOR 6.5 (GLOVE) ×2
GLOVE BIOGEL PI INDICATOR 7.0 (GLOVE) ×6
GOWN STRL REUS W/TWL LRG LVL3 (GOWN DISPOSABLE) ×12 IMPLANT
HANDLE SUCTION POOLE (INSTRUMENTS) ×1 IMPLANT
INST SET MAJOR GENERAL (KITS) ×2 IMPLANT
KIT BLADEGUARD II DBL (SET/KITS/TRAYS/PACK) ×2 IMPLANT
KIT ROOM TURNOVER APOR (KITS) ×2 IMPLANT
LIGASURE IMPACT 36 18CM CVD LR (INSTRUMENTS) ×2 IMPLANT
MANIFOLD NEPTUNE II (INSTRUMENTS) ×2 IMPLANT
NDL HYPO 21X1.5 SAFETY (NEEDLE) ×1 IMPLANT
NEEDLE HYPO 21X1.5 SAFETY (NEEDLE) ×2 IMPLANT
NS IRRIG 1000ML POUR BTL (IV SOLUTION) ×4 IMPLANT
PACK ABDOMINAL MAJOR (CUSTOM PROCEDURE TRAY) ×2 IMPLANT
PAD ARMBOARD 7.5X6 YLW CONV (MISCELLANEOUS) ×2 IMPLANT
PENCIL HANDSWITCHING (ELECTRODE) ×2 IMPLANT
POUCH OSTOMY 2 3/4  H 3804 (WOUND CARE)
POUCH OSTOMY 2 3/4 H 3804 (WOUND CARE)
POUCH OSTOMY 2 PC DRNBL 2.25 (WOUND CARE) IMPLANT
POUCH OSTOMY 2 PC DRNBL 2.75 (WOUND CARE) IMPLANT
POUCH OSTOMY DRNBL 2 1/4 (WOUND CARE)
RELOAD LINEAR CUT PROX 55 BLUE (ENDOMECHANICALS) IMPLANT
RELOAD PROXIMATE 75MM BLUE (ENDOMECHANICALS) IMPLANT
RELOAD STAPLE 55 3.8 BLU REG (ENDOMECHANICALS) IMPLANT
RELOAD STAPLE 75 3.8 BLU REG (ENDOMECHANICALS) IMPLANT
RETRACTOR WND ALEXIS 25 LRG (MISCELLANEOUS) IMPLANT
RTRCTR WOUND ALEXIS 25CM LRG (MISCELLANEOUS) ×2
SET BASIN LINEN APH (SET/KITS/TRAYS/PACK) ×2 IMPLANT
SPONGE LAP 18X18 X RAY DECT (DISPOSABLE) ×3 IMPLANT
STAPLER CUT CVD 40MM GREEN (STAPLE) ×1 IMPLANT
STAPLER GUN LINEAR PROX 60 (STAPLE) ×2 IMPLANT
STAPLER PROXIMATE 75MM BLUE (STAPLE) ×1 IMPLANT
STAPLER VISISTAT (STAPLE) ×2 IMPLANT
SUCTION POOLE HANDLE (INSTRUMENTS) ×2
SUCTION YANKAUER HANDLE (MISCELLANEOUS) ×2 IMPLANT
SUT CHROMIC 3 0 SH 27 (SUTURE) ×2 IMPLANT
SUT NOVA NAB GS-26 0 60 (SUTURE) IMPLANT
SUT PDS AB 0 CTX 60 (SUTURE) ×2 IMPLANT
SUT PROLENE 0 CT 1 30 (SUTURE) ×2 IMPLANT
SUT PROLENE 2 0 SH 30 (SUTURE) ×1 IMPLANT
SUT SILK 2 0 (SUTURE)
SUT SILK 2-0 18XBRD TIE 12 (SUTURE) IMPLANT
SUT SILK 3 0 SH CR/8 (SUTURE) ×1 IMPLANT
SYR 20CC LL (SYRINGE) ×2 IMPLANT
TOWEL OR 17X26 4PK STRL BLUE (TOWEL DISPOSABLE) ×2 IMPLANT
TRAY FOLEY CATH SILVER 16FR (SET/KITS/TRAYS/PACK) ×2 IMPLANT

## 2017-09-17 NOTE — Anesthesia Procedure Notes (Signed)
Procedure Name: Intubation Date/Time: 09/17/2017 1:09 PM Performed by: Franco NonesYates, Koi Zangara S, CRNA Pre-anesthesia Checklist: Patient identified, Patient being monitored, Timeout performed, Emergency Drugs available and Suction available Patient Re-evaluated:Patient Re-evaluated prior to induction Oxygen Delivery Method: Circle System Utilized Preoxygenation: Pre-oxygenation with 100% oxygen Induction Type: IV induction, Rapid sequence and Cricoid Pressure applied Ventilation: Mask ventilation without difficulty Laryngoscope Size: Miller and 2 Grade View: Grade I Tube type: Oral Tube size: 7.0 mm Number of attempts: 1 Airway Equipment and Method: Stylet Placement Confirmation: ETT inserted through vocal cords under direct vision,  positive ETCO2 and breath sounds checked- equal and bilateral Secured at: 25 cm Tube secured with: Tape Dental Injury: Teeth and Oropharynx as per pre-operative assessment

## 2017-09-17 NOTE — Telephone Encounter (Signed)
Communication noted.  Discussed with Dr. Estell HarpinZammit

## 2017-09-17 NOTE — Anesthesia Postprocedure Evaluation (Signed)
Anesthesia Post Note  Patient: Abigail ButtsMichael B Lubrano  Procedure(s) Performed: SIGMOID  COLECTOMY (N/A Abdomen) COLOSTOMY (N/A Abdomen)  Patient location during evaluation: PACU Anesthesia Type: General Level of consciousness: awake and alert and oriented Pain management: pain level controlled Vital Signs Assessment: post-procedure vital signs reviewed and stable Respiratory status: spontaneous breathing Cardiovascular status: blood pressure returned to baseline and stable : initially nauseated, resolving with zofran and  phenergan. Anesthetic complications: no     Last Vitals:  Vitals:   09/17/17 1630 09/17/17 1645  BP: (!) 169/101   Pulse: (!) 122 (!) 122  Resp: 12 12  Temp:    SpO2: 97% 98%    Last Pain:  Vitals:   09/17/17 1630  TempSrc:   PainSc: (P) 6                  Angi Goodell

## 2017-09-17 NOTE — Anesthesia Preprocedure Evaluation (Signed)
Anesthesia Evaluation  Patient identified by MRN, date of birth, ID band Patient awake    Airway Mallampati: I      Comment: Thick neck Dental no notable dental hx. (+) Teeth Intact   Pulmonary Current Smoker,    Pulmonary exam normal        Cardiovascular  Rhythm:Regular Rate:Normal     Neuro/Psych    GI/Hepatic S/p colonoscopy day 1,  Found to have stricuture.    Endo/Other    Renal/GU      Musculoskeletal   Abdominal Normal abdominal exam  (+)   Peds  Hematology   Anesthesia Other Findings   Reproductive/Obstetrics                             Anesthesia Physical Anesthesia Plan  ASA: II and emergent  Anesthesia Plan: General   Post-op Pain Management:    Induction:   PONV Risk Score and Plan: 1  Airway Management Planned: Oral ETT  Additional Equipment:   Intra-op Plan:   Post-operative Plan:   Informed Consent: I have reviewed the patients History and Physical, chart, labs and discussed the procedure including the risks, benefits and alternatives for the proposed anesthesia with the patient or authorized representative who has indicated his/her understanding and acceptance.   Dental advisory given  Plan Discussed with: CRNA and Surgeon  Anesthesia Plan Comments:         Anesthesia Quick Evaluation

## 2017-09-17 NOTE — Consult Note (Signed)
[facility] Consult  Reason for Referral: Colonic stricture, diverticulitis, small perforation  Referring Physician: Dr. Rourk and Dr. Ortiz   Chief Complaint    Abdominal Pain      Steven Mcbride is a 46 y.o. male.  HPI: Steven Mcbride is a 46 yo with multiple episodes of diverticulitis who underwent a colonoscopy yesterday and was found to have colonic stenosis that Dr. Rourk was able to traverse. Biopsies were taken of the site of inflammation, and the patient started having significant abdominal pain with nausea and vomiting last night. His pain was so severe his wife reports he was crying at times.  The pain was on the RLQ and his normal diverticulitis pain is on the LLQ.  He reports that he has had intermittent fevers and chills throughout the last 7 weeks with his most recent episodes of diverticulitis.  He has no family history of colon cancer prior to age 50, and reports only a remote history of rectal bleeding but nothing recently.   He underwent the full prep and only had minimal output but said the stool was starting to become clear. Dr. Rourk note reports that there was significant stool burden.  He was admitted last night after his CT scan, and I was consulted this AM.   Past Medical History:  Diagnosis Date  . Diverticulitis   . Gout     Past Surgical History:  Procedure Laterality Date  . COLONOSCOPY  09/15/2017   Dr. Rourk: scattered small and large-mouthed diverticula were found in sigmoid and descending colon. Benign-appearing, intrinsic severe stenosis in sigmoid colon that was chronically inflamed appearing, fibrotic, hypertrophied mucosa involving 15 cm segment of proximal sigmoid colon. Produced significant narrowing. Upstream in colon was formed and liquid stool, precluding entire evaluation.   . None      Family History  Problem Relation Age of Onset  . Hypertension Father   . Lung cancer Father   . Colon cancer Neg Hx     Social  History   Tobacco Use  . Smoking status: Current Every Day Smoker    Packs/day: 0.50    Years: 20.00    Pack years: 10.00    Types: Cigarettes  . Smokeless tobacco: Never Used  Substance Use Topics  . Alcohol use: Yes    Comment: 4x/week-3-4 beers, glasses of wine, or mixed drinks.  . Drug use: No   Medications Prior to Admission  Medication Sig Dispense Refill Last Dose  . acetaminophen (TYLENOL) 500 MG tablet Take 500 mg every 6 (six) hours as needed by mouth for mild pain or moderate pain.   09/16/2017 at Unknown time  . colchicine 0.6 MG tablet TAKE 2 TABLETS AT ONSET, THEN TAKE 1 TABLET 1 HOUR LATER. TAKE 1 TABLET TWICE DAILY FOR 2 MORE DAYS AS NEEDED FOR GOUT FLARES  0 unknown  . indomethacin (INDOCIN) 50 MG capsule take 50 mg by mouth three times a day if needed gout flares  0 unknown  . polyethylene glycol-electrolytes (TRILYTE) 420 g solution Take 4,000 mLs by mouth as directed. (Patient not taking: Reported on 09/16/2017) 4000 mL 0 Not Taking at Unknown time    No Known Allergies  ROS:  A comprehensive review of systems was negative except for: Constitutional: positive for chills, fevers, weight loss and change in diet to clear liquids Gastrointestinal: positive for abdominal pain, constipation, nausea and vomiting  Blood pressure 125/80, pulse 92, temperature 98.3 F (36.8 C), temperature source Oral, resp. rate 16, height   5' 11" (1.803 m), weight 201 lb 15.4 oz (91.6 kg), SpO2 97 %. Physical Exam  Results: Results for orders placed or performed during the hospital encounter of 09/16/17 (from the past 48 hour(s))  Urinalysis, dipstick only     Status: Abnormal   Collection Time: 09/16/17  9:14 PM  Result Value Ref Range   Color, Urine STRAW (A) YELLOW   APPearance CLEAR CLEAR   Specific Gravity, Urine 1.006 1.005 - 1.030   pH 6.0 5.0 - 8.0   Glucose, UA NEGATIVE NEGATIVE mg/dL   Hgb urine dipstick NEGATIVE NEGATIVE   Bilirubin Urine NEGATIVE NEGATIVE   Ketones, ur  NEGATIVE NEGATIVE mg/dL   Protein, ur NEGATIVE NEGATIVE mg/dL   Nitrite NEGATIVE NEGATIVE   Leukocytes, UA NEGATIVE NEGATIVE  CBC with Differential/Platelet     Status: Abnormal   Collection Time: 09/16/17  9:29 PM  Result Value Ref Range   WBC 12.9 (H) 4.0 - 10.5 K/uL   RBC 4.20 (L) 4.22 - 5.81 MIL/uL   Hemoglobin 13.2 13.0 - 17.0 g/dL   HCT 38.7 (L) 39.0 - 52.0 %   MCV 92.1 78.0 - 100.0 fL   MCH 31.4 26.0 - 34.0 pg   MCHC 34.1 30.0 - 36.0 g/dL   RDW 12.4 11.5 - 15.5 %   Platelets 362 150 - 400 K/uL   Neutrophils Relative % 73 %   Neutro Abs 9.4 (H) 1.7 - 7.7 K/uL   Lymphocytes Relative 16 %   Lymphs Abs 2.0 0.7 - 4.0 K/uL   Monocytes Relative 10 %   Monocytes Absolute 1.3 (H) 0.1 - 1.0 K/uL   Eosinophils Relative 1 %   Eosinophils Absolute 0.2 0.0 - 0.7 K/uL   Basophils Relative 0 %   Basophils Absolute 0.0 0.0 - 0.1 K/uL  Comprehensive metabolic panel     Status: Abnormal   Collection Time: 09/16/17  9:29 PM  Result Value Ref Range   Sodium 136 135 - 145 mmol/L   Potassium 3.5 3.5 - 5.1 mmol/L   Chloride 99 (L) 101 - 111 mmol/L   CO2 23 22 - 32 mmol/L   Glucose, Bld 94 65 - 99 mg/dL   BUN 10 6 - 20 mg/dL   Creatinine, Ser 0.69 0.61 - 1.24 mg/dL   Calcium 9.2 8.9 - 10.3 mg/dL   Total Protein 7.3 6.5 - 8.1 g/dL   Albumin 3.5 3.5 - 5.0 g/dL   AST 17 15 - 41 U/L   ALT 34 17 - 63 U/L   Alkaline Phosphatase 71 38 - 126 U/L   Total Bilirubin 0.4 0.3 - 1.2 mg/dL   GFR calc non Af Amer >60 >60 mL/min   GFR calc Af Amer >60 >60 mL/min    Comment: (NOTE) The eGFR has been calculated using the CKD EPI equation. This calculation has not been validated in all clinical situations. eGFR's persistently <60 mL/min signify possible Chronic Kidney Disease.    Anion gap 14 5 - 15  CBC WITH DIFFERENTIAL     Status: Abnormal   Collection Time: 09/17/17  4:16 AM  Result Value Ref Range   WBC 13.4 (H) 4.0 - 10.5 K/uL   RBC 3.77 (L) 4.22 - 5.81 MIL/uL   Hemoglobin 11.5 (L) 13.0 -  17.0 g/dL   HCT 35.4 (L) 39.0 - 52.0 %   MCV 93.9 78.0 - 100.0 fL   MCH 30.5 26.0 - 34.0 pg   MCHC 32.5 30.0 - 36.0 g/dL   RDW 12.1 11.5 -  15.5 %   Platelets 350 150 - 400 K/uL   Neutrophils Relative % 76 %   Neutro Abs 10.2 (H) 1.7 - 7.7 K/uL   Lymphocytes Relative 12 %   Lymphs Abs 1.6 0.7 - 4.0 K/uL   Monocytes Relative 11 %   Monocytes Absolute 1.5 (H) 0.1 - 1.0 K/uL   Eosinophils Relative 1 %   Eosinophils Absolute 0.1 0.0 - 0.7 K/uL   Basophils Relative 0 %   Basophils Absolute 0.1 0.0 - 0.1 K/uL    Personally reviewed the CT scan- small dot of air, no obvious free fluid, has significant stool burden past the stricture, long segment stricture noted   Ct Abdomen Pelvis W Contrast  Result Date: 09/16/2017 CLINICAL DATA:  Severe right lower quadrant pain since colonoscopy today. No good bowel movement in 7 weeks. Diagnosed with stricture. EXAM: CT ABDOMEN AND PELVIS WITH CONTRAST TECHNIQUE: Multidetector CT imaging of the abdomen and pelvis was performed using the standard protocol following bolus administration of intravenous contrast. CONTRAST:  119m ISOVUE-300 IOPAMIDOL (ISOVUE-300) INJECTION 61% COMPARISON:  None. FINDINGS: Lower chest: Atelectasis in the lung bases. Hepatobiliary: Multiple circumscribed low-attenuation lesions demonstrated throughout the liver. Largest is in the lateral segment left lobe and measures 2.2 cm diameter. Most of the other lesions are subcentimeter in size. These likely represent cysts. Gallbladder and bile ducts are unremarkable. Pancreas: Unremarkable. No pancreatic ductal dilatation or surrounding inflammatory changes. Spleen: Normal in size without focal abnormality. Adrenals/Urinary Tract: Adrenal glands are unremarkable. Kidneys are normal, without renal calculi, focal lesion, or hydronephrosis. There is asymmetric thickening of the anterior and left superior bladder wall. This likely represents reactive inflammatory process. No intraluminal filling  defects. Stomach/Bowel: There is diffuse wall thickening of the sigmoid colon with prominent fat stranding around the sigmoid colon and extending throughout the left pelvis as well as in the center anterior pelvis. Appearance is most consistent with diverticulitis although inflammatory process related to colonoscopy is not excluded. A perforated colon cancer could also be considered in the appropriate clinical setting. There is a small amount of free air demonstrated in the abdomen consistent with perforation, likely related to the sigmoid colon process. There is no evidence of proximal obstruction. No loculated collection to suggest an abscess. Stomach and small bowel are decompressed. The appendix is not identified. Vascular/Lymphatic: Aortic atherosclerosis. No enlarged abdominal or pelvic lymph nodes. Reproductive: Prostate gland is enlarged, measuring about 4.6 cm diameter. Other: No significant free fluid in the abdomen. Abdominal wall musculature appears intact. Musculoskeletal: Degenerative changes in the lumbar spine. Schmorl's node at L4. No destructive bone lesions. IMPRESSION: 1. Small amount of free intra-abdominal air consistent with bowel perforation. 2. Extensive inflammatory process in the pelvis involving the sigmoid colon with infiltration of the surrounding pelvic fat. Appearance is most consistent with perforated acute diverticulitis although perforated colon cancer or inflammatory process related to colonoscopy is not excluded. No discrete abscess. 3. Asymmetric left anterior bladder wall thickening is likely reactive to the inflammatory process in the pelvis. 4. Multiple low-attenuation lesions in the liver, likely cysts. 5. Prostate gland is enlarged. 6. Aortic atherosclerosis. These results were called by telephone at the time of interpretation on 09/16/2017 at 10:52 pm to Dr. JMilton Ferguson, who verbally acknowledged these results. Electronically Signed   By: WLucienne CapersM.D.   On:  09/16/2017 22:57    Assessment & Plan:  MJOHNATHIN VANDERSCHAAFis a 46y.o. 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  Unsure if perforation before or following colonoscopy but given the lack of complete prep and constipation related to the stricture, recommend that we go ahead and undergo surgery for stricture. Discussed sigmoid colectomy and possible colostomy versus ileostomy depending of the inflammation and infection. Also discussed the possibility of leaving the skin open for contamination.   -OR for sigmoid colectomy, possible ostomy  -Posted for later today   All questions were answered to the satisfaction of the patient and family.  The risk and benefits of colectomy and ostomy were discussed including but not limited to bleeding, infection, need for reoperation and reversal of ostomy after a 6-8 week period, and need for recovery after the surgery with no heavy lifting etc.  After careful consideration, Steven Mcbride has decided to proceed.    Lindsay C Bridges 09/17/2017, 8:39 AM       

## 2017-09-17 NOTE — Progress Notes (Signed)
Received GI consult for this nice gentleman known to us with a history of chronic recurrent diverticulitis over the past 10-12 years.  Initial episode in 2004. He had a history of chronic intermittent LLQ pain. Underwent colonoscopy yesterday due to reported low-volume hematochezia, which was uneventful. Findings of scattered small and large-mouthed diverticula noted in sigmoid and descending colon. A benign-appearing, intrinsic severe stenosis was noted in sigmoid colon, chronically inflamed appearing, fibrotic appearing, and hypertrophied involving 15 cm segment of proximal colon and producing narrowing. Upstream in the colon was liquid and formed stool, which precluded complete evaluation of entire colonic mucosa. GI had recommended surgical referral for resection of chronically diseased segment. Biopsies were obtained. CT was ordered as well.   Presented to ED yesterday, with CT completed and noted in medical record. General Surgery (Dr. Henreitta LeberBridges) has already evaluated patient and plans on surgery today.   We will see [name] as outpatient.   Steven MinkAnna W. Boone, PhD, ANP-BC [facility]

## 2017-09-17 NOTE — Addendum Note (Signed)
Addendum  created 09/17/17 1653 by Moshe Salisburyaniel, Elmer Boutelle E, CRNA   Intraprocedure Attestations filed

## 2017-09-17 NOTE — Op Note (Signed)
[facility] Operative Note  09/17/17  Preoperative Diagnosis: Perforated diverticulitis with colonic stricture    Postoperative Diagnosis: Same   Procedure(s) Performed: Hartman's Procedure  (sigmoid colectomy with end colostomy)    Surgeon: Leatrice JewelsLindsay C. Henreitta LeberBridges, MD   Assistants: No qualified resident was available   Anesthesia: General endotracheal   Anesthesiologist: Carlyle BasquesSchultz, John R, MD    Specimens: Sigmoid colon   Estimated Blood Loss: 150cc    Blood Replacement: None    Complications: None   Wound Class: Infected/ Dirty     Operative Indications: [name] is a 46 yo with multiple episodes of diverticulitis in the past who underwent a colonoscopy yesterday with Dr. Jena Gaussourk and was found to have a stenotic area in the sigmoid colon that was inflamed. Biospies were taken, and the patient went home. He returned to the ED with complaints of severe RLQ pain, and underwent a CT scan demonstrated a thickened sigmoid colon, a small dot of free air, and no free fluid. I was called regarding his colonic stricture and diverticulitis, and given his CT scan and perforation with incomplete bowel prep, I discussed the risk and benefits of exploration and sigmoid colectomy with the patient who opted to proceed, understanding the risk and benefits of possible  Colostomy versus anastomosis with ileostomy pending the operative findings.   Findings: Inflammed and thickened sigmoid colon with purulent drainage and active inflammation and infection. Purulent drainage from sigmoid colon with manipulation.  Intramural abscess noted on opening the specimen. No signs of ulceration, mass, or polyp, all consistent with active diverticulitis.  An inflamed cecum with an area of fibrinous material.   Procedure: The patient was taken to the operating room and placed in lithotomy position given the possibility of possible anastomosis. General endotracheal anesthesia was induced. Intravenous  antibiotics were administered per protocol, repeating a dose of Cefotetan given that his Pincus Sanesnvanz was given more than 1 hr prior.  An orogastric tube positioned to decompress the stomach.  A foley catheter was placed. The abdomen was prepared and draped in the usual sterile fashion and the peritoneum was prepped and draped.   A vertical midline incision was made around the umbilicus.  This was deepened to the subcutaneous tissues and hemostasis was achieved with electrocautery.  The linea alba was identified and incised and the peritoneal cavity was entered.  The abdomen was explored.  There was evidence of peritonitis in the left lower quadrant with inflamed and infected peritoneal lining.  The remainder of the bowel was inspected and the cecum was noted to be somewhat inflamed with area of fibrinous material overlying it.  It was difficult to say if the cecum had inflammation secondary to the sigmoid colon versus if this could have been the site of a possible perforation.  There were no signs of any active perforation or bowel spillage noted.  The small bowel was inspected and retracted to the right using moist laparotomy pad and the self-retaining retractor.  With great difficulty electrocautery was used to free up the peritoneal attachments along the line of Toldt proximally on the left colon toward the pelvic inlet.  There was significant inflammation tacking the sigmoid colon up into the pelvis anteriorly this was carefully dissected out with blunt dissection as well as electrocautery.  The left ureter was identified and protected.  After freeing up the sigmoid colon anteriorly I was able to palpate a healthy segment of colon in the pelvis.  The point of transection proximally was identified where healthy  normal bowel was present on the left colon the bowel was divided with a linear cutting stapler.  The peritoneum overlying the mesentery was then scored with electrocautery and the mesentery was taken with a  LigaSure device.  The mesentery was taken close to the colon given that this was a diverticulitis and not a cancer in order to further assure that no injury was made to the ureters.  After transecting the mesentery with the LigaSure device the specimen was more freely mobilized out of the pelvis.  The distal end of transection was just at the pelvic brim at the level of the colorectal junction where normal bowel was present.  The distal bowel was transected using the contoured, curved stapling device.  The remaining mesentery was divided with the LigaSure device.  Stitch was placed in the proximal and to mark this end.  The abdomen was copiously irrigated and hemostasis was achieved.  Again the left ureter was identified and noted to be intact.  Given that the mesentery was taken along the colon the right ureter was not within the operative field.  A 2-0 Prolene suture was placed at the colorectal stump to mark the area.  The proximal colon was then mobilized by freeing up the peritoneal attachments along the line of Toldt more proximally towards the splenic flexure.  After doing this the proximal colon easily reached to the proposed colostomy site without tension.  A disc of skin was removed from the colostomy site in the left lower quadrant.  The incision was deepened through all the layers of the abdominal wall and dilated to admit 2 fingers.  The colon was passed through the ostomy site without torsion or tension and a Babcock was placed on the end.  Final inspection revealed acceptable hemostasis and the bowel was in proper alignment.    Prior to closing, the specimen was opened and demonstrated a thickened colonic wall with intramural purulence.  There was no signs of masses, ulcerations, or polyps concerning for cancer in this specimen.  My gown and gloves were then changed.   The midline incision was closed with a running 0 Prolene suture, and irrigated. The skin was closed with staples.  A laparotomy  pad was placed over the midline incision.  The colostomy was matured with multiple interrupted 3-0 chromic sutures.  The ostomy was digitalized easily.  An ostomy bag was placed.  The final sterile dressing was placed over the midline incision.  All counts were correct at the end of the case. The patient was awakened from anesthesia and extubate without complication.  The patient went to the PACU in stable condition. His foley will remain in place overnight.    Algis GreenhouseLindsay Bridges, MD [facility] [address] [place], [place] [id number] [phone] (office)

## 2017-09-17 NOTE — Transfer of Care (Signed)
Immediate Anesthesia Transfer of Care Note  Patient: Steven ButtsMichael B Mcbride  Procedure(s) Performed: SIGMOID  COLECTOMY (N/A Abdomen) COLOSTOMY (N/A Abdomen)  Patient Location: PACU  Anesthesia Type:General  Level of Consciousness: awake and alert   Airway & Oxygen Therapy: Patient Spontanous Breathing and Patient connected to face mask oxygen  Post-op Assessment: Report given to RN  Post vital signs: Reviewed and stable  Last Vitals:  Vitals:   09/17/17 1245 09/17/17 1608  BP:  (!) 168/103  Pulse:  (!) 114  Resp: 19 18  Temp:  37.3 C  SpO2: 98% 100%    Last Pain:  Vitals:   09/17/17 1140  TempSrc: Oral  PainSc: 3       Patients Stated Pain Goal: 3 (09/17/17 1140)  Complications: No apparent anesthesia complications

## 2017-09-17 NOTE — Progress Notes (Signed)
Sigmoid colon biopsies revealed benign, hemorrhagic mucosa-nonspecific;  I appreciate Dr. Henreitta LeberBridges help with this nice gentleman.

## 2017-09-18 ENCOUNTER — Encounter (HOSPITAL_COMMUNITY): Payer: Self-pay | Admitting: General Surgery

## 2017-09-18 DIAGNOSIS — K5720 Diverticulitis of large intestine with perforation and abscess without bleeding: Principal | ICD-10-CM

## 2017-09-18 DIAGNOSIS — K5792 Diverticulitis of intestine, part unspecified, without perforation or abscess without bleeding: Secondary | ICD-10-CM

## 2017-09-18 DIAGNOSIS — K56699 Other intestinal obstruction unspecified as to partial versus complete obstruction: Secondary | ICD-10-CM

## 2017-09-18 LAB — BASIC METABOLIC PANEL
Anion gap: 11 (ref 5–15)
BUN: 7 mg/dL (ref 6–20)
CHLORIDE: 101 mmol/L (ref 101–111)
CO2: 22 mmol/L (ref 22–32)
Calcium: 8.2 mg/dL — ABNORMAL LOW (ref 8.9–10.3)
Creatinine, Ser: 0.77 mg/dL (ref 0.61–1.24)
GFR calc Af Amer: >60 mL/min (ref >60)
GLUCOSE: 79 mg/dL (ref 65–99)
POTASSIUM: 3.7 mmol/L (ref 3.5–5.1)
Sodium: 134 mmol/L — ABNORMAL LOW (ref 135–145)

## 2017-09-18 LAB — CBC
HCT: 38.2 % — ABNORMAL LOW (ref 39.0–52.0)
Hemoglobin: 12.6 g/dL — ABNORMAL LOW (ref 13.0–17.0)
MCH: 31.1 pg (ref 26.0–34.0)
MCHC: 33 g/dL (ref 30.0–36.0)
MCV: 94.3 fL (ref 78.0–100.0)
PLATELETS: 332 10*3/uL (ref 150–400)
RBC: 4.05 MIL/uL — AB (ref 4.22–5.81)
RDW: 12.4 % (ref 11.5–15.5)
WBC: 11.1 10*3/uL — ABNORMAL HIGH (ref 4.0–10.5)

## 2017-09-18 LAB — HIV ANTIBODY (ROUTINE TESTING W REFLEX): HIV Screen 4th Generation wRfx: NONREACTIVE

## 2017-09-18 MED ORDER — POTASSIUM CHLORIDE CRYS ER 20 MEQ PO TBCR
40.0000 meq | EXTENDED_RELEASE_TABLET | Freq: Once | ORAL | Status: AC
  Administered 2017-09-18: 40 meq via ORAL
  Filled 2017-09-18: qty 2

## 2017-09-18 MED ORDER — POTASSIUM CHLORIDE 20 MEQ PO PACK: 40.0000 meq | PACK | Freq: Once | ORAL | Status: DC

## 2017-09-18 NOTE — Progress Notes (Signed)
[facility] Progress Note  1 Day Post-Op  Subjective: Bloated this AM. Otherwise pain is now under control. No output from the ostomy just bowel sweat. UOP good overnight.   Objective: Vital signs in last 24 hours: Temp:  [98.1 F (36.7 C)-99.2 F (37.3 C)] 98.6 F (37 C) (11/07 0645) Pulse Rate:  [100-122] 103 (11/07 0645) Resp:  [11-19] 16 (11/06 1832) BP: (135-169)/(81-103) 156/88 (11/07 0645) SpO2:  [92 %-100 %] 97 % (11/07 0645)    Intake/Output from previous day: 11/06 0701 - 11/07 0700 In: 3702.5 [I.V.:3002.5; IV Piggyback:700] Out: 2200 [Urine:2050; Blood:150] Intake/Output this shift: No intake/output data recorded.  General appearance: alert, cooperative and no distress Resp: normal work breathing GI: distended, appropriately tender, midline dressing in place, minimal staining, ostomy pink with some swelling, sweat in bag Extremities: extremities normal, atraumatic, no cyanosis or edema  Lab Results:  Recent Labs    09/17/17 0416 09/18/17 0536  WBC 13.4* 11.1*  HGB 11.5* 12.6*  HCT 35.4* 38.2*  PLT 350 332   BMET Recent Labs    09/16/17 2129 09/18/17 0536  NA 136 134*  K 3.5 3.7  CL 99* 101  CO2 23 22  GLUCOSE 94 79  BUN 10 7  CREATININE 0.69 0.77  CALCIUM 9.2 8.2*   PT/INR No results for input(s): LABPROT, INR in the last 72 hours.  Studies/Results: Ct Abdomen Pelvis W Contrast  Result Date: 09/16/2017 CLINICAL DATA:  Severe right lower quadrant pain since colonoscopy today. No good bowel movement in 7 weeks. Diagnosed with stricture. EXAM: CT ABDOMEN AND PELVIS WITH CONTRAST TECHNIQUE: Multidetector CT imaging of the abdomen and pelvis was performed using the standard protocol following bolus administration of intravenous contrast. CONTRAST:  100mL ISOVUE-300 IOPAMIDOL (ISOVUE-300) INJECTION 61% COMPARISON:  None. FINDINGS: Lower chest: Atelectasis in the lung bases. Hepatobiliary: Multiple circumscribed low-attenuation  lesions demonstrated throughout the liver. Largest is in the lateral segment left lobe and measures 2.2 cm diameter. Most of the other lesions are subcentimeter in size. These likely represent cysts. Gallbladder and bile ducts are unremarkable. Pancreas: Unremarkable. No pancreatic ductal dilatation or surrounding inflammatory changes. Spleen: Normal in size without focal abnormality. Adrenals/Urinary Tract: Adrenal glands are unremarkable. Kidneys are normal, without renal calculi, focal lesion, or hydronephrosis. There is asymmetric thickening of the anterior and left superior bladder wall. This likely represents reactive inflammatory process. No intraluminal filling defects. Stomach/Bowel: There is diffuse wall thickening of the sigmoid colon with prominent fat stranding around the sigmoid colon and extending throughout the left pelvis as well as in the center anterior pelvis. Appearance is most consistent with diverticulitis although inflammatory process related to colonoscopy is not excluded. A perforated colon cancer could also be considered in the appropriate clinical setting. There is a small amount of free air demonstrated in the abdomen consistent with perforation, likely related to the sigmoid colon process. There is no evidence of proximal obstruction. No loculated collection to suggest an abscess. Stomach and small bowel are decompressed. The appendix is not identified. Vascular/Lymphatic: Aortic atherosclerosis. No enlarged abdominal or pelvic lymph nodes. Reproductive: Prostate gland is enlarged, measuring about 4.6 cm diameter. Other: No significant free fluid in the abdomen. Abdominal wall musculature appears intact. Musculoskeletal: Degenerative changes in the lumbar spine. Schmorl's node at L4. No destructive bone lesions. IMPRESSION: 1. Small amount of free intra-abdominal air consistent with bowel perforation. 2. Extensive inflammatory process in the pelvis involving the sigmoid colon with  infiltration of the surrounding pelvic fat. Appearance is most  consistent with perforated acute diverticulitis although perforated colon cancer or inflammatory process related to colonoscopy is not excluded. No discrete abscess. 3. Asymmetric left anterior bladder wall thickening is likely reactive to the inflammatory process in the pelvis. 4. Multiple low-attenuation lesions in the liver, likely cysts. 5. Prostate gland is enlarged. 6. Aortic atherosclerosis. These results were called by telephone at the time of interpretation on 09/16/2017 at 10:52 pm to Dr. Bethann BerkshireJOSEPH ZAMMIT , who verbally acknowledged these results. Electronically Signed   By: Burman NievesWilliam  Stevens M.D.   On: 09/16/2017 22:57    Anti-infectives: Anti-infectives (From admission, onward)   Start     Dose/Rate Route Frequency Ordered Stop   09/18/17 1300  ertapenem (INVANZ) 1 g in sodium chloride 0.9 % 50 mL IVPB     1 g 100 mL/hr over 30 Minutes Intravenous Every 24 hours 09/17/17 1753 09/20/17 1259   09/17/17 1345  cefoTEtan (CEFOTAN) 2 g in dextrose 5 % 50 mL IVPB     2 g 100 mL/hr over 30 Minutes Intravenous On call to O.R. 09/17/17 1329 09/17/17 1404   09/17/17 1200  ertapenem (INVANZ) 1 g in sodium chloride 0.9 % 50 mL IVPB     1 g 100 mL/hr over 30 Minutes Intravenous On call to O.R. 09/17/17 0949 09/17/17 1243   09/17/17 1100  ciprofloxacin (CIPRO) IVPB 400 mg  Status:  Discontinued     400 mg 200 mL/hr over 60 Minutes Intravenous Every 12 hours 09/17/17 0212 09/18/17 0806   09/17/17 0830  metroNIDAZOLE (FLAGYL) IVPB 500 mg  Status:  Discontinued     500 mg 100 mL/hr over 60 Minutes Intravenous Every 8 hours 09/17/17 0212 09/18/17 0806   09/16/17 2300  metroNIDAZOLE (FLAGYL) IVPB 500 mg     500 mg 100 mL/hr over 60 Minutes Intravenous  Once 09/16/17 2256 09/17/17 0223   09/16/17 2300  ciprofloxacin (CIPRO) IVPB 400 mg     400 mg 200 mL/hr over 60 Minutes Intravenous  Once 09/16/17 2256 09/17/17 0029       Assessment/Plan: Mr. [name] is a 46 yo POD 1 s/p Hartman's procedure for perforated diverticulitis with partial stenosis.  He has a little ileus this AM. Otherwise doing ok.  -PRN for pain, tylenol scheduled  -IS, OOB, needs to ambulate -Sips and chips only while having ileus, will await bowel function -WBC down, Invanz for 2 days post op given the infection intrabdominal with purulence in the wall of the sigmoid colon -Ostomy care RN ordered, home health ordered pending patient's need -K replaced, UOP adequate and yellow, d/c foley, LR @ 75 for now  -SCDs, lovenox    LOS: 2 days    Lucretia RoersLindsay C Bridges 09/18/2017

## 2017-09-18 NOTE — Anesthesia Postprocedure Evaluation (Signed)
Anesthesia Post Note  Patient: Steven Mcbride  Procedure(s) Performed: SIGMOID  COLECTOMY (N/A Abdomen) COLOSTOMY (N/A Abdomen)  Patient location during evaluation: Nursing Unit Anesthesia Type: General Level of consciousness: awake and alert, oriented and patient cooperative Pain management: pain level controlled Vital Signs Assessment: post-procedure vital signs reviewed and stable Respiratory status: spontaneous breathing and respiratory function stable Cardiovascular status: stable Postop Assessment: no apparent nausea or vomiting Anesthetic complications: no     Last Vitals:  Vitals:   09/17/17 2251 09/18/17 0645  BP: 135/81 (!) 156/88  Pulse: 100 (!) 103  Resp:    Temp: 36.8 C 37 C  SpO2: 97% 97%    Last Pain:  Vitals:   09/18/17 1024  TempSrc:   PainSc: 5                  ADAMS, AMY A

## 2017-09-18 NOTE — Progress Notes (Signed)
Subjective:  POD #1. Feels bloating. No ostomy output yet. Ambulated couple of times. Not hungry. Pain being managed.   Objective: Vital signs in last 24 hours: Temp:  [98.1 F (36.7 C)-99.2 F (37.3 C)] 98.6 F (37 C) (11/07 0645) Pulse Rate:  [100-122] 103 (11/07 0645) Resp:  [11-19] 16 (11/06 1832) BP: (135-169)/(81-103) 156/88 (11/07 0645) SpO2:  [92 %-100 %] 97 % (11/07 0645)   General:   Alert,  Well-developed, well-nourished, pleasant and cooperative in NAD. MOther and wife at bedside.  Head:  Normocephalic and atraumatic. Eyes:  Sclera clear, no icterus.  Abdomen:  Soft, hypoactive BS. Ostomy pink. Midline dressing with mild staining.    Extremities:  Without clubbing, deformity or edema. Neurologic:  Alert and  oriented x4;  grossly normal neurologically. Skin:  Intact without significant lesions or rashes. Psych:  Alert and cooperative. Normal mood and affect.  Intake/Output from previous day: 11/06 0701 - 11/07 0700 In: 3702.5 [I.V.:3002.5; IV Piggyback:700] Out: 2200 [Urine:2050; Blood:150] Intake/Output this shift: No intake/output data recorded.  Lab Results: CBC Recent Labs    09/16/17 2129 09/17/17 0416 09/18/17 0536  WBC 12.9* 13.4* 11.1*  HGB 13.2 11.5* 12.6*  HCT 38.7* 35.4* 38.2*  MCV 92.1 93.9 94.3  PLT 362 350 332   BMET Recent Labs    09/16/17 2129 09/18/17 0536  NA 136 134*  K 3.5 3.7  CL 99* 101  CO2 23 22  GLUCOSE 94 79  BUN 10 7  CREATININE 0.69 0.77  CALCIUM 9.2 8.2*   LFTs Recent Labs    09/16/17 2129  BILITOT 0.4  ALKPHOS 71  AST 17  ALT 34  PROT 7.3  ALBUMIN 3.5   No results for input(s): LIPASE in the last 72 hours. PT/INR No results for input(s): LABPROT, INR in the last 72 hours.    Imaging Studies: Ct Abdomen Pelvis W Contrast  Result Date: 09/16/2017 CLINICAL DATA:  Severe right lower quadrant pain since colonoscopy today. No good bowel movement in 7 weeks. Diagnosed with stricture. EXAM: CT ABDOMEN AND  PELVIS WITH CONTRAST TECHNIQUE: Multidetector CT imaging of the abdomen and pelvis was performed using the standard protocol following bolus administration of intravenous contrast. CONTRAST:  100mL ISOVUE-300 IOPAMIDOL (ISOVUE-300) INJECTION 61% COMPARISON:  None. FINDINGS: Lower chest: Atelectasis in the lung bases. Hepatobiliary: Multiple circumscribed low-attenuation lesions demonstrated throughout the liver. Largest is in the lateral segment left lobe and measures 2.2 cm diameter. Most of the other lesions are subcentimeter in size. These likely represent cysts. Gallbladder and bile ducts are unremarkable. Pancreas: Unremarkable. No pancreatic ductal dilatation or surrounding inflammatory changes. Spleen: Normal in size without focal abnormality. Adrenals/Urinary Tract: Adrenal glands are unremarkable. Kidneys are normal, without renal calculi, focal lesion, or hydronephrosis. There is asymmetric thickening of the anterior and left superior bladder wall. This likely represents reactive inflammatory process. No intraluminal filling defects. Stomach/Bowel: There is diffuse wall thickening of the sigmoid colon with prominent fat stranding around the sigmoid colon and extending throughout the left pelvis as well as in the center anterior pelvis. Appearance is most consistent with diverticulitis although inflammatory process related to colonoscopy is not excluded. A perforated colon cancer could also be considered in the appropriate clinical setting. There is a small amount of free air demonstrated in the abdomen consistent with perforation, likely related to the sigmoid colon process. There is no evidence of proximal obstruction. No loculated collection to suggest an abscess. Stomach and small bowel are decompressed. The appendix is not identified.  Vascular/Lymphatic: Aortic atherosclerosis. No enlarged abdominal or pelvic lymph nodes. Reproductive: Prostate gland is enlarged, measuring about 4.6 cm diameter. Other:  No significant free fluid in the abdomen. Abdominal wall musculature appears intact. Musculoskeletal: Degenerative changes in the lumbar spine. Schmorl's node at L4. No destructive bone lesions. IMPRESSION: 1. Small amount of free intra-abdominal air consistent with bowel perforation. 2. Extensive inflammatory process in the pelvis involving the sigmoid colon with infiltration of the surrounding pelvic fat. Appearance is most consistent with perforated acute diverticulitis although perforated colon cancer or inflammatory process related to colonoscopy is not excluded. No discrete abscess. 3. Asymmetric left anterior bladder wall thickening is likely reactive to the inflammatory process in the pelvis. 4. Multiple low-attenuation lesions in the liver, likely cysts. 5. Prostate gland is enlarged. 6. Aortic atherosclerosis. These results were called by telephone at the time of interpretation on 09/16/2017 at 10:52 pm to Dr. Bethann BerkshireJOSEPH ZAMMIT , who verbally acknowledged these results. Electronically Signed   By: Burman NievesWilliam  Stevens M.D.   On: 09/16/2017 22:57  [2 weeks]   Assessment: Chronic recurrent diverticulitis with benign appearing intrinsic severe stenosis in sigmoid colon on colonoscopy involving 15 cm segment of proximal colon and producing narrowing.  Upstream colon was dilated.  Presented to the ED after colonoscopy, CT showed small amount of free intra-abdominal air, findings consistent with perforated acute diverticulitis.  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  Clinically doing well postop day 1.  Sigmoid colon Biopsies from colonoscopy reveal benign, hemorrhagic mucosa.  Nonspecific.   Plan: 1. Appreciate Dr. Henreitta LeberBridges assistance with Mr. Bascom LevelsFrazier.  2. Patient will need colonoscopy to complete his exam approximately six months after his colostomy reversal.   Leanna BattlesLeslie S. Dixon BoosLewis, PA-C [facility] [phone] 11/7/201810:27 AM     LOS: 2 days

## 2017-09-18 NOTE — Consult Note (Addendum)
WOC Nurse ostomy consult note Stoma type/location: Consult requested for new colostomy; surgery performed yesterday on 11/6 Stomal assessment/size: Stoma red and viable, flush with skin level, 1 inch and os points downward Peristomal assessment: Intact skin surrounding.  Honeycomb dressing in place over the abd incision. Output: No stool or flatus, small amt pink drainage in the pouch. Ostomy pouching: 2pc.  Education provided:  Current pouch is leaking behind the barrier.  Applied 2 piece pouch with barrier ring to attempt to maintain a seal.  Pt and wife at the bedside watched the procedure and asked appropriate questions.  Pt is able to open and close the velcro to empty.  Educational materials left at the bedside and 5 extra sets of barrier rings, barriers, and pouches ordered to the room for staff nurse use.  Reviewed pouching routines and ordering supplies.  WOC team will plan for another teaching session on Friday.   Enrolled patient in [employer] DC program: Yes; ordered 2 piece pouching appliances with convexity to be sent to the patient's house.  Discussed use of convex pouching system with patient, since I feel he will eventually need this option. Steven Mcbride Steven Pellum MSN, RN, CWOCN, [place]CN-AP, CNS [phone]

## 2017-09-18 NOTE — Addendum Note (Signed)
Addendum  created 09/18/17 1056 by Earleen NewportAdams, Adelle Zachar A, CRNA   Sign clinical note

## 2017-09-18 NOTE — Progress Notes (Signed)
Doing fair. No ostomy output. Ambulated. Pain about the same. Taking meds. Sips and chips going ok. Ostomy RN has been by to see him.  Ostomy pink.   Algis GreenhouseLindsay Trinitey Roache, MD [facility] [address] [place], [place] [id number] [phone] (office)

## 2017-09-19 ENCOUNTER — Ambulatory Visit (HOSPITAL_COMMUNITY): Payer: BLUE CROSS/BLUE SHIELD

## 2017-09-19 LAB — BASIC METABOLIC PANEL
ANION GAP: 12 (ref 5–15)
BUN: 6 mg/dL (ref 6–20)
CHLORIDE: 100 mmol/L — AB (ref 101–111)
CO2: 23 mmol/L (ref 22–32)
CREATININE: 0.68 mg/dL (ref 0.61–1.24)
Calcium: 8.4 mg/dL — ABNORMAL LOW (ref 8.9–10.3)
GFR calc non Af Amer: >60 mL/min (ref >60)
GLUCOSE: 66 mg/dL (ref 65–99)
Potassium: 3.9 mmol/L (ref 3.5–5.1)
Sodium: 135 mmol/L (ref 135–145)

## 2017-09-19 LAB — CBC
HEMATOCRIT: 35.2 % — AB (ref 39.0–52.0)
HEMOGLOBIN: 11.7 g/dL — AB (ref 13.0–17.0)
MCH: 31.2 pg (ref 26.0–34.0)
MCHC: 33.2 g/dL (ref 30.0–36.0)
MCV: 93.9 fL (ref 78.0–100.0)
Platelets: 357 10*3/uL (ref 150–400)
RBC: 3.75 MIL/uL — ABNORMAL LOW (ref 4.22–5.81)
RDW: 12.3 % (ref 11.5–15.5)
WBC: 9.9 10*3/uL (ref 4.0–10.5)

## 2017-09-19 MED ORDER — KCL IN DEXTROSE-NACL 20-5-0.45 MEQ/L-%-% IV SOLN
INTRAVENOUS | Status: DC
Start: 2017-09-19 — End: 2017-09-23
  Administered 2017-09-19 – 2017-09-23 (×6): via INTRAVENOUS

## 2017-09-19 MED ORDER — METOPROLOL TARTRATE 5 MG/5ML IV SOLN
5.0000 mg | Freq: Once | INTRAVENOUS | Status: AC
  Administered 2017-09-19: 5 mg via INTRAVENOUS
  Filled 2017-09-19: qty 5

## 2017-09-19 NOTE — Progress Notes (Signed)
[facility] Progress Note  2 Days Post-Op  Subjective: No major issues. Pain improved. No nausea but no ostomy output. Tolerating some ice/ sips.  Ambulated this AM.   Objective: Vital signs in last 24 hours: Temp:  [97.3 F (36.3 C)-97.7 F (36.5 C)] 97.7 F (36.5 C) (11/08 0700) Pulse Rate:  [98-107] 98 (11/08 0700) Resp:  [17-18] 18 (11/08 0700) BP: (157-162)/(91-97) 157/91 (11/08 0700) SpO2:  [95 %-99 %] 96 % (11/08 0700)    Intake/Output from previous day: 11/07 0701 - 11/08 0700 In: 1630 [I.V.:1580; IV Piggyback:50] Out: -  Intake/Output this shift: No intake/output data recorded.  General appearance: alert, cooperative and no distress Resp: normal work breathing GI: soft, mildly distended, appropriately tender, incision c/d/i with staples, no erythema or drainage, ostomy pink, bag with sweat Extremities: extremities normal, atraumatic, no cyanosis or edema  Lab Results:  Recent Labs    09/18/17 0536 09/19/17 0419  WBC 11.1* 9.9  HGB 12.6* 11.7*  HCT 38.2* 35.2*  PLT 332 357   BMET Recent Labs    09/18/17 0536 09/19/17 0419  NA 134* 135  K 3.7 3.9  CL 101 100*  CO2 22 23  GLUCOSE 79 66  BUN 7 6  CREATININE 0.77 0.68  CALCIUM 8.2* 8.4*   PT/INR No results for input(s): LABPROT, INR in the last 72 hours.  Studies/Results: No results found.  Anti-infectives: Anti-infectives (From admission, onward)   Start     Dose/Rate Route Frequency Ordered Stop   09/18/17 1300  ertapenem (INVANZ) 1 g in sodium chloride 0.9 % 50 mL IVPB     1 g 100 mL/hr over 30 Minutes Intravenous Every 24 hours 09/17/17 1753 09/20/17 1259   09/17/17 1345  cefoTEtan (CEFOTAN) 2 g in dextrose 5 % 50 mL IVPB     2 g 100 mL/hr over 30 Minutes Intravenous On call to O.R. 09/17/17 1329 09/17/17 1404   09/17/17 1200  ertapenem (INVANZ) 1 g in sodium chloride 0.9 % 50 mL IVPB     1 g 100 mL/hr over 30 Minutes Intravenous On call to O.R. 09/17/17 0949 09/17/17  1243   09/17/17 1100  ciprofloxacin (CIPRO) IVPB 400 mg  Status:  Discontinued     400 mg 200 mL/hr over 60 Minutes Intravenous Every 12 hours 09/17/17 0212 09/18/17 0806   09/17/17 0830  metroNIDAZOLE (FLAGYL) IVPB 500 mg  Status:  Discontinued     500 mg 100 mL/hr over 60 Minutes Intravenous Every 8 hours 09/17/17 0212 09/18/17 0806   09/16/17 2300  metroNIDAZOLE (FLAGYL) IVPB 500 mg     500 mg 100 mL/hr over 60 Minutes Intravenous  Once 09/16/17 2256 09/17/17 0223   09/16/17 2300  ciprofloxacin (CIPRO) IVPB 400 mg     400 mg 200 mL/hr over 60 Minutes Intravenous  Once 09/16/17 2256 09/17/17 0029      Assessment/Plan: Mr. [name] is a 46 yo POD 2 s/p Hartman's procedure for perforated diverticulitis with partial stenosis.  Improving.  -PRN for pain -IS, OOB, ambulate -Sips/ chips, can have coffee and non carbonated drinks, awaiting ostomy function for more advancement  -WBC normal, H&H stable, perioperative invanz ends tomorrow, no labs tomorrow -Ostomy care, Ostomy RN ahs seen -D51/2NS + 20 K @ 50 for IVF, D/c LR, UOP good -SCDs, lovenox -Can shower    LOS: 3 days    Lucretia RoersLindsay C Bridges 09/19/2017

## 2017-09-20 NOTE — Care Management Note (Signed)
Case Management Note  Patient Details  Name: Steven Mcbride MRN: [id number] Date of Birth: April 13, 1971  Subjective/Objective:               S/p colectomy and colostomy. Pt is from home, lives with wife. He is ind with ADL's. He will need HH RN for ostomy care education after DC. [facility].      Action/Plan[facility] Bonita QuinLinda, [facility], aware of [facility].   Expected Discharge Date:     09/21/2017             Expected Discharge Plan:  Home w Home Health Services  In-House Referral:  NA  Discharge planning Services  CM Consult  Post Acute Care Choice:  Home Health Choice offered to:  Patient  HH Arr[facility]  [facility]  [facility]  Status of Service:  Completed, signed off  Malcolm MetroChildress, Mikaiah Stoffer Demske, RN 09/20/2017, 1:37 PM

## 2017-09-20 NOTE — Consult Note (Signed)
WOC Nurse ostomy follow up Stoma type/location: LLQ Colsotomy Stomal assessment/size: Current pouch is intact.  Room is dark, wife not present and no output in pouch  Patient does not wish to change pouch at this time.  Supplies are in room. He has ostomy booklet (states, "my wife has it").  Today, we practiced opening and closing pouch to empty.  Emptying when 1/3 full.  No stool in pouch yet.  He feels nauseated this AM.  Discussed twice weekly pouch changes.  Reinforced purpose of barrier ring and two piece system.   Peristomal assessment: not assessed.  Refused pouch change.  Last done 2 days ago and is intact.  Treatment options for stomal/peristomal skin: barrier ring Output none.  Blood tinged liquid in pouch Ostomy pouching: 2pc. 2 3/4" pouch at bedside.   Education provided: Discussed and demonstrated emptying and discussed twice weekly pouch changes.  He does not want pouch change now.  He is nauseated and no ostomy output.  "there is no point in changing" Enrolled patient in [employer] Discharge program: Yes WOC team will follow.  Maple HudsonKaren Sadik Piascik RN BSN CWON Pager [phone]

## 2017-09-21 MED ORDER — LABETALOL HCL 5 MG/ML IV SOLN
10.0000 mg | Freq: Four times a day (QID) | INTRAVENOUS | Status: DC | PRN
  Administered 2017-09-21 – 2017-09-23 (×3): 10 mg via INTRAVENOUS
  Filled 2017-09-21 (×3): qty 4

## 2017-09-21 NOTE — Progress Notes (Addendum)
Late Entry  No major issues. Still distended, ambulating. Pain controlled.   VS reviewed in chart Normal work breathing Soft, mildly distended, ostomy pink without gas in bag MAE  Sips/ Chips PRN for pain Continue to await bowel function Ambulate SCDs, lovenox   Algis GreenhouseLindsay Bridges, MD [facility] [address] [place], [place] [phone] (office)

## 2017-09-21 NOTE — Progress Notes (Signed)
[facility] Progress Note  4 Days Post-Op  Subjective: No major issues. Still feeling somewhat bloated. No gas in bag.   Pathology: Diagnosis Colon, segmental resection, sigmoid DIVERTICULA WITH INFLAMMATION, PERFORATION AND ABSCESS MARGINS OF RESECTION ARE VIABLE NEGATIVE FOR MALIGNANCY FOUR BENIGN LYMPH NODES  Objective: Vital signs in last 24 hours: Temp:  [97.6 F (36.4 C)-98.1 F (36.7 C)] 97.6 F (36.4 C) (11/10 0521) Pulse Rate:  [84-96] 84 (11/10 0521) Resp:  [18-20] 18 (11/10 0521) BP: (160-171)/(95-104) 160/96 (11/10 0521) SpO2:  [96 %-98 %] 96 % (11/10 0521)    Intake/Output from previous day: 11/09 0701 - 11/10 0700 In: 0  Out: 800 [Urine:800] Intake/Output this shift: No intake/output data recorded.  General appearance: alert, cooperative and no distress Resp: normal work of breathing GI: soft, mildly distended, incision c/d/i with staples, no erythema or drainage, ostomy pink and no gas in bag, digitalized straight down easily Extremities: extremities normal, atraumatic, no cyanosis or edema  Lab Results:  Recent Labs    09/19/17 0419  WBC 9.9  HGB 11.7*  HCT 35.2*  PLT 357   BMET Recent Labs    09/19/17 0419  NA 135  K 3.9  CL 100*  CO2 23  GLUCOSE 66  BUN 6  CREATININE 0.68  CALCIUM 8.4*   PT/INR No results for input(s): LABPROT, INR in the last 72 hours.  Studies/Results: No results found.  Anti-infectives: Anti-infectives (From admission, onward)   Start     Dose/Rate Route Frequency Ordered Stop   09/18/17 1300  ertapenem (INVANZ) 1 g in sodium chloride 0.9 % 50 mL IVPB     1 g 100 mL/hr over 30 Minutes Intravenous Every 24 hours 09/17/17 1753 09/19/17 1559   09/17/17 1345  cefoTEtan (CEFOTAN) 2 g in dextrose 5 % 50 mL IVPB     2 g 100 mL/hr over 30 Minutes Intravenous On call to O.R. 09/17/17 1329 09/17/17 1404   09/17/17 1200  ertapenem (INVANZ) 1 g in sodium chloride 0.9 % 50 mL IVPB     1 g 100  mL/hr over 30 Minutes Intravenous On call to O.R. 09/17/17 0949 09/17/17 1243   09/17/17 1100  ciprofloxacin (CIPRO) IVPB 400 mg  Status:  Discontinued     400 mg 200 mL/hr over 60 Minutes Intravenous Every 12 hours 09/17/17 0212 09/18/17 0806   09/17/17 0830  metroNIDAZOLE (FLAGYL) IVPB 500 mg  Status:  Discontinued     500 mg 100 mL/hr over 60 Minutes Intravenous Every 8 hours 09/17/17 0212 09/18/17 0806   09/16/17 2300  metroNIDAZOLE (FLAGYL) IVPB 500 mg     500 mg 100 mL/hr over 60 Minutes Intravenous  Once 09/16/17 2256 09/17/17 0223   09/16/17 2300  ciprofloxacin (CIPRO) IVPB 400 mg     400 mg 200 mL/hr over 60 Minutes Intravenous  Once 09/16/17 2256 09/17/17 0029      Assessment/Plan: [name] is a 46 yo POD 4 s/p Hartman's procedure for perforated diverticulitis with partial stenosis.  Improving.  -PRN for pain -IS, OOB, ambulate -Sips/ chips, coffee, if starts having gas can have clears  -Labs tomorrow to check BMP  -Ostomy care, Ostomy RN ahs seen -D51/2NS + 20 K @ 50 for IVF, UOP good  -SCDs, lovenox   LOS: 5 days    Lucretia RoersLindsay C Jamey Demchak 09/21/2017

## 2017-09-22 ENCOUNTER — Inpatient Hospital Stay (HOSPITAL_COMMUNITY): Payer: BLUE CROSS/BLUE SHIELD

## 2017-09-22 LAB — MAGNESIUM: Magnesium: 1.8 mg/dL (ref 1.7–2.4)

## 2017-09-22 LAB — BASIC METABOLIC PANEL
ANION GAP: 9 (ref 5–15)
BUN: 6 mg/dL (ref 6–20)
CHLORIDE: 100 mmol/L — AB (ref 101–111)
CO2: 29 mmol/L (ref 22–32)
Calcium: 9.3 mg/dL (ref 8.9–10.3)
Creatinine, Ser: 0.58 mg/dL — ABNORMAL LOW (ref 0.61–1.24)
GFR calc non Af Amer: >60 mL/min (ref >60)
Glucose, Bld: 112 mg/dL — ABNORMAL HIGH (ref 65–99)
POTASSIUM: 3.9 mmol/L (ref 3.5–5.1)
Sodium: 138 mmol/L (ref 135–145)

## 2017-09-22 LAB — PHOSPHORUS: PHOSPHORUS: 3.9 mg/dL (ref 2.5–4.6)

## 2017-09-22 MED ORDER — POTASSIUM CHLORIDE CRYS ER 20 MEQ PO TBCR
20.0000 meq | EXTENDED_RELEASE_TABLET | Freq: Once | ORAL | Status: AC
  Administered 2017-09-22: 20 meq via ORAL
  Filled 2017-09-22: qty 1

## 2017-09-22 NOTE — Progress Notes (Signed)
[facility] Progress Note  5 Days Post-Op  Subjective: No major issues but still no ostomy output. Has not had to use any nausea medications and feeling hungry.   Objective: Vital signs in last 24 hours: Temp:  [97.5 F (36.4 C)-98.6 F (37 C)] 97.5 F (36.4 C) (11/11 0546) Pulse Rate:  [75-95] 75 (11/11 0546) Resp:  [16-18] 18 (11/11 0546) BP: (144-169)/(96-108) 149/96 (11/11 0546) SpO2:  [95 %-97 %] 97 % (11/11 0546)    Intake/Output from previous day: 11/10 0701 - 11/11 0700 In: 600 [I.V.:600] Out: -  Intake/Output this shift: No intake/output data recorded.  General appearance: alert, cooperative and no distress Resp: normal work breathing GI: soft, mildly distended, appropriately tender, incision c/d/i with staples, ostomy pink, digitalized to fascia with ease Extremities: extremities normal, atraumatic, no cyanosis or edema  Lab Results:  No results for input(s): WBC, HGB, HCT, PLT in the last 72 hours. BMET Recent Labs    09/22/17 0603  NA 138  K 3.9  CL 100*  CO2 29  GLUCOSE 112*  BUN 6  CREATININE 0.58*  CALCIUM 9.3    Anti-infectives: Anti-infectives (From admission, onward)   Start     Dose/Rate Route Frequency Ordered Stop   09/18/17 1300  ertapenem (INVANZ) 1 g in sodium chloride 0.9 % 50 mL IVPB     1 g 100 mL/hr over 30 Minutes Intravenous Every 24 hours 09/17/17 1753 09/19/17 1559   09/17/17 1345  cefoTEtan (CEFOTAN) 2 g in dextrose 5 % 50 mL IVPB     2 g 100 mL/hr over 30 Minutes Intravenous On call to O.R. 09/17/17 1329 09/17/17 1404   09/17/17 1200  ertapenem (INVANZ) 1 g in sodium chloride 0.9 % 50 mL IVPB     1 g 100 mL/hr over 30 Minutes Intravenous On call to O.R. 09/17/17 0949 09/17/17 1243   09/17/17 1100  ciprofloxacin (CIPRO) IVPB 400 mg  Status:  Discontinued     400 mg 200 mL/hr over 60 Minutes Intravenous Every 12 hours 09/17/17 0212 09/18/17 0806   09/17/17 0830  metroNIDAZOLE (FLAGYL) IVPB 500 mg  Status:   Discontinued     500 mg 100 mL/hr over 60 Minutes Intravenous Every 8 hours 09/17/17 0212 09/18/17 0806   09/16/17 2300  metroNIDAZOLE (FLAGYL) IVPB 500 mg     500 mg 100 mL/hr over 60 Minutes Intravenous  Once 09/16/17 2256 09/17/17 0223   09/16/17 2300  ciprofloxacin (CIPRO) IVPB 400 mg     400 mg 200 mL/hr over 60 Minutes Intravenous  Once 09/16/17 2256 09/17/17 0029      Assessment/Plan: [name] is a 46 yo POD 5 s/p Hartman's procedure for perforated diverticulitis with partial stenosis. Still no ostomy output but having bowel sounds and not requiring nausea medication and feeling hungry.   -PRN for pain -IS, OOB, ambulate -Clears ordered, discussed going slow with the patient -Abd Xray ordered to assess ileus  -K replaced, no labs tomorrow  -Ostomy care, Ostomy RN ahs seen -MIVF @ 50, UOP good  -SCDs, lovenox    LOS: 6 days    Lucretia RoersLindsay C Ponce Skillman 09/22/2017

## 2017-09-23 MED ORDER — PANTOPRAZOLE SODIUM 40 MG PO TBEC
40.0000 mg | DELAYED_RELEASE_TABLET | Freq: Every day | ORAL | Status: DC
  Administered 2017-09-23 – 2017-09-24 (×2): 40 mg via ORAL
  Filled 2017-09-23 (×2): qty 1

## 2017-09-23 NOTE — Consult Note (Signed)
WOC Nurse ostomy follow up Stoma type/location: LLQ colostomy  Pouch change today with spouse at bedside Stomal assessment/size: 1 1/4" round pink moist stoma.  Mucus in pouch Peristomal assessment: intact  Stoma is slightly flush from 6 to 8 o'clock.  Barrier ring used to promote seal Treatment options for stomal/peristomal skin: barrier ring Output none  Mucus only Ostomy pouching:2pc. 2 2/4 pouch with barrier ring  Education provided: Pouch change performed by patient with wife at bedside.  Patient able to measure stoma and cut pouch.  Applied barrier ring and two piece system.  Wife states her father has a colostomy as well.  Able to empty and open, close pouch.  States they feel pretty good about self care of ostomy [place] [employer] [place] Dis[place]: Yes Contact info given for follow up questions related to ostomy care and supplies.  Maple HudsonKaren Miamarie Moll RN BSN CWON Pager [phone]

## 2017-09-23 NOTE — Progress Notes (Signed)
Received a verbal order from Dr. Henreitta LeberBridges to D/C patient's IV per his request.

## 2017-09-23 NOTE — Progress Notes (Signed)
[facility] Progress Note  6 Days Post-Op  Subjective: Ostomy making gas but minimal liquid. Tolerating clears. Feeling better. Ostomy RN helped with teaching this AM.   Objective: Vital signs in last 24 hours: Temp:  [97.8 F (36.6 C)-98.4 F (36.9 C)] 97.8 F (36.6 C) (11/12 0625) Pulse Rate:  [83-92] 92 (11/12 0625) Resp:  [16] 16 (11/12 0625) BP: (158-164)/(91-97) 164/94 (11/12 0625) SpO2:  [96 %-99 %] 96 % (11/12 0625)    Intake/Output from previous day: 11/11 0701 - 11/12 0700 In: 1680 [P.O.:480; I.V.:1200] Out: -  Intake/Output this shift: No intake/output data recorded.  General appearance: alert, cooperative and no distress Resp: normal work breathing GI: soft, minimally distended, ostomy with gas, pink, midline c/d/i with staples, no erythema or drainage Extremities: extremities normal, atraumatic, no cyanosis or edema  Lab Results:  No results for input(s): WBC, HGB, HCT, PLT in the last 72 hours. BMET Recent Labs    09/22/17 0603  NA 138  K 3.9  CL 100*  CO2 29  GLUCOSE 112*  BUN 6  CREATININE 0.58*  CALCIUM 9.3   Studies/Results: Dg Abd 2 Views  Result Date: 09/22/2017 CLINICAL DATA:  Colectomy in 2 state.  Ileus. EXAM: ABDOMEN - 2 VIEW COMPARISON:  CT of the abdomen 09/16/2017 FINDINGS: There is gaseous distension of colon. Borderline abnormal gas is distension of small bowel loops, maximum diameter of 3.4 cm. No radiographic evidence of organomegaly or free intra-abdominal gas. Postsurgical changes with skin staples are noted. IMPRESSION: Findings consistent with ileus versus early/incomplete distal colonic obstruction. Electronically Signed   By: Ted Mcalpineobrinka  Dimitrova M.D.   On: 09/22/2017 11:34    Anti-infectives: Anti-infectives (From admission, onward)   Start     Dose/Rate Route Frequency Ordered Stop   09/18/17 1300  ertapenem (INVANZ) 1 g in sodium chloride 0.9 % 50 mL IVPB     1 g 100 mL/hr over 30 Minutes Intravenous  Every 24 hours 09/17/17 1753 09/19/17 1559   09/17/17 1345  cefoTEtan (CEFOTAN) 2 g in dextrose 5 % 50 mL IVPB     2 g 100 mL/hr over 30 Minutes Intravenous On call to O.R. 09/17/17 1329 09/17/17 1404   09/17/17 1200  ertapenem (INVANZ) 1 g in sodium chloride 0.9 % 50 mL IVPB     1 g 100 mL/hr over 30 Minutes Intravenous On call to O.R. 09/17/17 0949 09/17/17 1243   09/17/17 1100  ciprofloxacin (CIPRO) IVPB 400 mg  Status:  Discontinued     400 mg 200 mL/hr over 60 Minutes Intravenous Every 12 hours 09/17/17 0212 09/18/17 0806   09/17/17 0830  metroNIDAZOLE (FLAGYL) IVPB 500 mg  Status:  Discontinued     500 mg 100 mL/hr over 60 Minutes Intravenous Every 8 hours 09/17/17 0212 09/18/17 0806   09/16/17 2300  metroNIDAZOLE (FLAGYL) IVPB 500 mg     500 mg 100 mL/hr over 60 Minutes Intravenous  Once 09/16/17 2256 09/17/17 0223   09/16/17 2300  ciprofloxacin (CIPRO) IVPB 400 mg     400 mg 200 mL/hr over 60 Minutes Intravenous  Once 09/16/17 2256 09/17/17 0029      Assessment/Plan: Steven Mcbride is a 46 yo POD6s/p Hartman's procedure for perforated diverticulitis with partial stenosis. Ileus resolving. -PRN for pain -IS, OOB, ambulate -Soft diet  -Ostomy care, Ostomy RN ahs seen -UOP good, d/c IVF -SCDs, lovenox -Likely home tomorrow    LOS: 7 days    Steven Mcbride 09/23/2017

## 2017-09-23 NOTE — Progress Notes (Signed)
Pt without IV access. Spoke to MD on call and switched IV protonix to PO.

## 2017-09-24 MED ORDER — DOCUSATE SODIUM 100 MG PO CAPS: 100.0000 mg | ORAL_CAPSULE | Freq: Two times a day (BID) | ORAL | 0 refills | Status: DC

## 2017-09-24 MED ORDER — OXYCODONE HCL 5 MG PO TABS: 5.0000 mg | ORAL_TABLET | ORAL | 0 refills | Status: DC | PRN

## 2017-09-24 MED ORDER — ONDANSETRON 4 MG PO TBDP: 4.0000 mg | ORAL_TABLET | Freq: Four times a day (QID) | ORAL | 0 refills | Status: DC | PRN

## 2017-09-24 NOTE — Care Management Note (Signed)
Case Management Note  Patient Details  Name: Steven ButtsMichael B Mcbride MRN: [id number] Date of Birth: 1971/07/14 Expected Discharge Date:  09/24/17               Expected Discharge Plan:  Home w Home Health Services  In-House Referral:  NA  Discharge planning Services  CM Consult  Post Acute Care Choice:  Home Health Choice offered to:  Patient  HH [facility]  [facility]  [facility]  Status of Service:  Completed, signed off  If discussed at Long Length of Stay Meetings, dates discussed:  09/24/2017  Additional Comments: DC home today with HH. Bonita QuinLinda, [facility] aware of [facility], will see pt and pull info from chart. Pt aware HH has 48 hrs to make first visit.   Malcolm Metrohildress, Evart Mcdonnell Demske, RN 09/24/2017, 9:31 AM

## 2017-09-24 NOTE — Discharge Instructions (Signed)
Discharge Instructions: Shower per your regular routine. No bathing in tub.  Take tylenol and ibuprofen as needed for pain control, alternating every 4-6 hours.  Take Roxicodone for breakthrough pain. Take colace for constipation related to narcotic pain medication. Leave staples in place. Ostomy Care per your normal routine as instructed by the ostomy RN.  Home health should be coming to see you in the next few days. Diverticulitis Diverticulitis is when small pockets in your large intestine (colon) get infected or swollen. This causes stomach pain and watery poop (diarrhea). These pouches are called diverticula. They form in people who have a condition called diverticulosis. Follow these instructions at home: Medicines  Take over-the-counter and prescription medicines only as told by your doctor. These include: ? Antibiotics. ? Pain medicines. ? Fiber pills. ? Probiotics. ? Stool softeners.  Do not drive or use heavy machinery while taking prescription pain medicine.  If you were prescribed an antibiotic, take it as told. Do not stop taking it even if you feel better. General instructions  Follow a diet as told by your doctor.  When you feel better, your doctor may tell you to change your diet. You may need to eat a lot of fiber. Fiber makes it easier to poop (have bowel movements). Healthy foods with fiber include: ? Berries. ? Beans. ? Lentils. ? Green vegetables.  Exercise 3 or more times a week. Aim for 30 minutes each time. Exercise enough to sweat and make your heart beat faster.  Keep all follow-up visits as told. This is important. You may need to have an exam of the large intestine. This is called a colonoscopy. Contact a doctor if:  Your pain does not get better.  You have a hard time eating or drinking.  You are not pooping like normal. Get help right away if:  Your pain gets worse.  Your problems do not get better.  Your problems get worse very  fast.  You have a fever.  You throw up (vomit) more than one time.  You have poop that is: ? Bloody. ? Black. ? Tarry. Summary  Diverticulitis is when small pockets in your large intestine (colon) get infected or swollen.  Take medicines only as told by your doctor.  Follow a diet as told by your doctor. This information is not intended to replace advice given to you by your health care provider. Make sure you discuss any questions you have with your health care provider. Document Released: 04/16/2008 Document Revised: 11/15/2016 Document Reviewed: 11/15/2016 Elsevier Interactive Patient Education  2017 [employer] Colostomy, Adult, Care After Refer to this sheet in the next few weeks. These instructions provide you with information about caring for yourself after your procedure. Your health care provider may also give you more specific instructions. Your treatment has been planned according to current medical practices, but problems sometimes occur. Call your health care provider if you have any problems or questions after your procedure. What can I expect after the procedure? After the procedure, it is common to have:  Swelling at the opening that was created during the procedure (stoma).  Slight bleeding around the stoma.  Redness around the stoma.  Follow these instructions at home: Activity  Rest as needed while the stoma area heals.  Return to your normal activities as told by your health care provider. Ask your health care provider what activities are safe for you.  Avoid strenuous activity and abdominal exercises for 3 weeks or for as long as told  by your health care provider.  Do not lift anything that is heavier than 10 lb (4.5 kg). Incision care   Follow instructions from your health care provider about how to take care of your incision. Make sure you: ? Wash your hands with soap and water before you change your bandage (dressing). If soap and water are not  available, use hand sanitizer. ? Change your dressing as told by your health care provider. ? Leave stitches (sutures), skin glue, or adhesive strips in place. These skin closures may need to stay in place for 2 weeks or longer. If adhesive strip edges start to loosen and curl up, you may trim the loose edges. Do not remove adhesive strips completely unless your health care provider tells you to do that. Stoma Care  Keep the stoma area clean.  Clean and dry the skin around the stoma each time you change the colostomy bag. To clean the stoma area: ? Use warm water and only use cleansers that are recommended by your health care provider. ? Rinse the stoma area with plain water. ? Dry the area well.  Use stoma powder or ointment on your skin only as told by your health care provider. Do not use any other powders, gels, wipes, or creams on your skin.  Check the stoma area every day for signs of infection. Check for: ? More redness, swelling, or pain. ? More fluid or blood. ? Pus or warmth.  Measure the stoma opening regularly and record the size. Watch for changes. Share this information with your health care provider. Bathing  Do not take baths, swim, or use a hot tub until your health care provider approves. Ask your health care provider if you can take showers. You may be able to shower with or without the colostomy bag in place. If you bathe with the bag on, dry the bag afterward.  Avoid using harsh or oily soaps when you bathe. Colostomy Bag Care  Follow instructions from your health care provider about how to empty or change the colostomy bag.  Keep colostomy supplies with you at all times.  Store all supplies in a cool, dry place.  Empty the colostomy bag: ? Whenever it is one-third to one-half full. ? At bedtime.  Replace the bag every 2-4 days or as told by your health care provider. Driving  Do not drive for 24 hours if you received a sedative.  Do not drive or operate  heavy machinery while taking prescription pain medicine. General instructions  Follow instructions from your health care provider about eating or drinking restrictions.  Take over-the-counter and prescription medicines only as told by your health care provider.  Avoid wearing clothes that are tight directly over your stoma.  Do not use any tobacco products, such as cigarettes, chewing tobacco, and e-cigarettes. If you need help quitting, ask your health care provider.  (Women) Ask your health care provider about becoming pregnant and about using birth control. Medicines may not be absorbed normally after the procedure.  Keep all follow-up visits as told by your health care provider. This is important. Contact a health care provider if:  You are having trouble caring for your stoma or changing the colostomy bag.  You feel nauseous or you vomit.  You have a fever.  You havemore redness, swelling, or pain at the site of your stoma or around your anus.  You have more fluid or blood coming from your stoma or your anus.  Your stoma area feels  warm to the touch.  You have pus coming from your stoma.  You notice a change in the size or appearance of the stoma.  You have abdominal pain, bloating, pressure, or cramping.  Your have stool more often or less often than your health care provider tells you to expect.  You are not making much urine. This may be a sign of dehydration. Get help right away if:  Your abdominal pain does not go away or it becomes severe.  You keep vomiting.  Your stool is not draining through the stoma.  You have chest pain or an irregular heartbeat. This information is not intended to replace advice given to you by your health care provider. Make sure you discuss any questions you have with your health care provider. Document Released: 03/21/2011 Document Revised: 03/08/2016 Document Reviewed: 07/12/2015 Elsevier Interactive Patient Education  2018  [employer].

## 2017-09-24 NOTE — Discharge Summary (Signed)
Physician Discharge Summary  Patient ID: Abigail ButtsMichael B Blanchfield MRN: [id number] DOB/AGE: May 03, 1971 46 y.o.  Admit date: 09/16/2017 Discharge date: 09/24/2017  Admission Diagnoses: Acute perforated diverticulitis   Discharge Diagnoses:  Principal Problem:   Acute diverticulitis Active Problems:   Gout   Tobacco use   Colon stricture (HCC)   Diverticulitis of large intestine with perforation without bleeding   Discharged Condition: good  Hospital Course: Mr. Bascom LevelsFrazier is a very pleasant 46 yo with acute diverticulitis who underwent a colonoscopy 11/5 with Dr. Jena Gaussourk and experienced significant abdominal pain that night. A CT scan was performed with a small spot of free air, and the patient had an incomplete bowel prep prior to his colonoscopy.  He had been having obstructive type symptoms for some time and had a stenotic area on his colonoscopy and reported difficult colonoscopy.  He was admitted to the hospitalist service, and I was consulted the next morning.   Given this clinical picture, he was taken to the OR 11/6 for exploration and hartman's procedure.  Post operatively he did well but did have an ileus given the active inflammation and infection present in his abdomen at the time of surgery.  As he progressed he was having good pain control, tolerating a diet, and was learning to care for his ostomy.  Today he was having good ostomy output and gas from his ostomy.  He as urinating well and was without an IV for the last day of his hospital stay after it was lost. Today he was ready for discharge home.  Pathology: Diagnosis Colon, segmental resection, sigmoid DIVERTICULA WITH INFLAMMATION, PERFORATION AND ABSCESS MARGINS OF RESECTION ARE VIABLE NEGATIVE FOR MALIGNANCY FOUR BENIGN LYMPH NODES  Consults: GI and hospitalist  Significant Diagnostic Studies:  CT a/p  IMPRESSION: 1. Small amount of free intra-abdominal air consistent with bowel perforation. 2. Extensive inflammatory  process in the pelvis involving the sigmoid colon with infiltration of the surrounding pelvic fat. Appearance is most consistent with perforated acute diverticulitis although perforated colon cancer or inflammatory process related to colonoscopy is not excluded. No discrete abscess. 3. Asymmetric left anterior bladder wall thickening is likely reactive to the inflammatory process in the pelvis. 4. Multiple low-attenuation lesions in the liver, likely cysts. 5. Prostate gland is enlarged. 6. Aortic atherosclerosis.   Treatments: Exploratory laparotomy, Hartman's Procedure (Sigmoidectomy and end colostomy)  Discharge Exam: Blood pressure (!) 140/91, pulse 73, temperature 98.1 F (36.7 C), temperature source Oral, resp. rate 16, height 5\' 11"  (1.803 m), weight 201 lb 15.4 oz (91.6 kg), SpO2 97 %. General appearance: alert, cooperative and no distress Resp: normal work breathing GI: soft, minimally tender, staples c/d/i without erythema or drainage, ostomy pink with gas and liquid stool in bag Extremities: extremities normal, atraumatic, no cyanosis or edema  Disposition: 01-Home or Self Care  Discharge Instructions    Call MD for:  difficulty breathing, headache or visual disturbances   Complete by:  As directed    Call MD for:  extreme fatigue   Complete by:  As directed    Call MD for:  hives   Complete by:  As directed    Call MD for:  persistant dizziness or light-headedness   Complete by:  As directed    Call MD for:  persistant nausea and vomiting   Complete by:  As directed    Call MD for:  redness, tenderness, or signs of infection (pain, swelling, redness, odor or green/yellow discharge around incision site)   Complete by:  As directed    Call MD for:  severe uncontrolled pain   Complete by:  As directed    Call MD for:  temperature >100.4   Complete by:  As directed    Diet - low sodium heart healthy   Complete by:  As directed    Discharge instructions   Complete  by:  As directed    Discharge Instructions: Shower per your regular routine. No bathing in tub.  Take tylenol and ibuprofen as needed for pain control, alternating every 4-6 hours.  Take Roxicodone for breakthrough pain. Take colace for constipation related to narcotic pain medication. Leave staples in place. Ostomy Care per your normal routine as instructed by the ostomy RN.  Home health should be coming to see you in the next few days.   Increase activity slowly   Complete by:  As directed      Allergies as of 09/24/2017   No Known Allergies     Medication List    STOP taking these medications   polyethylene glycol-electrolytes 420 g solution Commonly known as:  TRILYTE     TAKE these medications   acetaminophen 500 MG tablet Commonly known as:  TYLENOL Take 500 mg every 6 (six) hours as needed by mouth for mild pain or moderate pain.   colchicine 0.6 MG tablet TAKE 2 TABLETS AT ONSET, THEN TAKE 1 TABLET 1 HOUR LATER. TAKE 1 TABLET TWICE DAILY FOR 2 MORE DAYS AS NEEDED FOR GOUT FLARES   docusate sodium 100 MG capsule Commonly known as:  COLACE Take 1 capsule (100 mg total) 2 (two) times daily by mouth.   indomethacin 50 MG capsule Commonly known as:  INDOCIN take 50 mg by mouth three times a day if needed gout flares   ondansetron 4 MG disintegrating tablet Commonly known as:  ZOFRAN-ODT Take 1 tablet (4 mg total) every 6 (six) hours as needed by mouth for nausea.   oxyCODONE 5 MG immediate release tablet Commonly known as:  Oxy IR/ROXICODONE Take 1 tablet (5 mg total) every 4 (four) hours as needed by mouth for severe pain or breakthrough pain.      Follow-up Information    Lucretia RoersBridges, Fin Hupp C, MD Follow up on 10/01/2017.   Specialty:  General Surgery Why:  at 11:30 am Contact information: [address] [place] [facility] [place] [address] [phone]        [facility] Follow up.   Specialty:  Home Health Services Contact  information: [address] [place] [address] [phone]           Signed: Lucretia RoersLindsay C Siyana Erney 09/24/2017, 3:14 PM

## 2017-09-24 NOTE — Progress Notes (Signed)
Pt discharged home today per Dr. Henreitta LeberBridges.  Pt's VSS. Pt provided with home medication list, discharge instructions and prescriptions. Verbalized understanding. Pt left floor via WC in stable condition accompanied by NT.

## 2017-09-26 DIAGNOSIS — Z433 Encounter for attention to colostomy: Secondary | ICD-10-CM | POA: Diagnosis not present

## 2017-09-26 DIAGNOSIS — F17210 Nicotine dependence, cigarettes, uncomplicated: Secondary | ICD-10-CM | POA: Diagnosis not present

## 2017-09-26 DIAGNOSIS — Z48815 Encounter for surgical aftercare following surgery on the digestive system: Secondary | ICD-10-CM | POA: Diagnosis not present

## 2017-09-26 DIAGNOSIS — M109 Gout, unspecified: Secondary | ICD-10-CM | POA: Diagnosis not present

## 2017-09-26 DIAGNOSIS — Z79891 Long term (current) use of opiate analgesic: Secondary | ICD-10-CM | POA: Diagnosis not present

## 2017-10-01 ENCOUNTER — Ambulatory Visit (INDEPENDENT_AMBULATORY_CARE_PROVIDER_SITE_OTHER): Payer: Self-pay | Admitting: General Surgery

## 2017-10-01 VITALS — BP 153/94 | HR 90 | Temp 98.7°F | Resp 18 | Ht 71.0 in | Wt 189.0 lb

## 2017-10-01 DIAGNOSIS — Z933 Colostomy status: Secondary | ICD-10-CM

## 2017-10-02 ENCOUNTER — Encounter: Payer: Self-pay | Admitting: General Surgery

## 2017-10-02 DIAGNOSIS — Z933 Colostomy status: Secondary | ICD-10-CM | POA: Diagnosis not present

## 2017-10-02 NOTE — Progress Notes (Signed)
[facility] Note   HPI:  46 y.o. Male presents to clinic for post-op follow-up evaluation after a hartman's procedure for perforated diverticulitis with stricture. The patient is doing great post operatively. He is managing his ostomy, and having no complaints.  He is eating, his pain is controlled, and he is back at work.  Review of Systems:  No fevers or chills Ostomy output daily  All other review of systems: otherwise negative   Vital Signs:  BP (!) 153/94   Pulse 90   Temp 98.7 F (37.1 C)   Resp 18   Ht 5\' 11"  (1.803 m)   Wt 189 lb (85.7 kg)   BMI 26.36 kg/m     Physical Exam:  Physical Exam  Constitutional: He is oriented to person, place, and time and well-developed, well-nourished, and in no distress.  HENT:  Head: Normocephalic.  Eyes: Pupils are equal, round, and reactive to light.  Cardiovascular: Normal rate.  Pulmonary/Chest: Effort normal and breath sounds normal.  Abdominal: Soft. He exhibits no distension. There is no tenderness. There is no rebound.  Left lower quadrant ostomy with bag, stool in bag, midline with staples c/d/i with no erythema or drainage, removed  Musculoskeletal: Normal range of motion.  Neurological: He is alert and oriented to person, place, and time.  Skin: Skin is warm and dry.  Psychiatric: Mood, memory, affect and judgment normal.  Vitals reviewed.   Laboratory studies: None  Imaging:  None    Assessment:  46 y.o. yo Male with a end colostomy following a hartman's for perforated diverticulitis with stricture on 09/17/2017. He is doing great.  Plan:  - Will plan to see at the end of December to discuss the next steps for reversal including a barium enema   Future Appointments  Date Time Provider Department Center  11/07/2017  3:30 PM Lucretia RoersBridges, Axzel [place] C, MD [facility] None     All of the above recommendations were discussed with the patient, and all of patient's questions were answered to his expressed  satisfaction.  Algis GreenhouseLindsay Bayler Gehrig, MD [facility] [address] [place], [place] [phone] (office)

## 2017-11-07 ENCOUNTER — Encounter: Payer: Self-pay | Admitting: General Surgery

## 2017-11-07 ENCOUNTER — Ambulatory Visit (INDEPENDENT_AMBULATORY_CARE_PROVIDER_SITE_OTHER): Payer: Self-pay | Admitting: General Surgery

## 2017-11-07 ENCOUNTER — Ambulatory Visit: Payer: BLUE CROSS/BLUE SHIELD | Admitting: General Surgery

## 2017-11-07 ENCOUNTER — Other Ambulatory Visit: Payer: Self-pay | Admitting: General Surgery

## 2017-11-07 VITALS — BP 171/98 | HR 89 | Temp 99.3°F | Resp 18 | Ht 71.0 in | Wt 194.0 lb

## 2017-11-07 DIAGNOSIS — Z933 Colostomy status: Secondary | ICD-10-CM

## 2017-11-07 MED ORDER — NEOMYCIN SULFATE 500 MG PO TABS: 1000.0000 mg | ORAL_TABLET | ORAL | 0 refills | Status: DC

## 2017-11-07 MED ORDER — METRONIDAZOLE 500 MG PO TABS: 500.0000 mg | ORAL_TABLET | ORAL | 0 refills | Status: DC

## 2017-11-07 NOTE — Progress Notes (Signed)
[facility] History and Physical  End Colostomy in Place  Chief Complaint    Follow-up      Steven Mcbride is a 46 y.o. male.  HPI: Steven Mcbride is well known to me following a Hartman's procedure 11/6 for perforated diverticulitis with colonic stricture. He had underwent a colonoscopy the day prior and presented to the hospital with severe abdominal pain and findings of some free air.  The colonoscopy did get to evaluate the entire colon as they were able to traverse the stricture.  He did well post operatively, and has been healing since that time. He has had a great holiday and has been back to work. He is ready to get his ostomy reversed at this time.  He denies any issues with the ostomy and is able to care for the bag without problems.  He has a well healed midline scar, and is feeling stronger everyday.  He is active and has no complaints of any pain, fever, or chill.   Past Medical History:  Diagnosis Date  . Diverticulitis   . Gout     Past Surgical History:  Procedure Laterality Date  . BIOPSY  09/16/2017   Procedure: BIOPSY;  Surgeon: Daneil Dolin, MD;  Location: [facility];  Service: Endoscopy;;  sigmoid colon;  . COLONOSCOPY  09/15/2017   Dr. Gala Romney: scattered small and large-mouthed diverticula were found in sigmoid and descending colon. Benign-appearing, intrinsic severe stenosis in sigmoid colon that was chronically inflamed appearing, fibrotic, hypertrophied mucosa involving 15 cm segment of proximal sigmoid colon. Produced significant narrowing. Upstream in colon was formed and liquid stool, precluding entire evaluation.   . COLONOSCOPY WITH PROPOFOL N/A 09/16/2017   Procedure: COLONOSCOPY WITH PROPOFOL;  Surgeon: Daneil Dolin, MD;  Location: [facility];  Service: Endoscopy;  Laterality: N/A;  9:15am  . COLOSTOMY N/A 09/17/2017   Procedure: COLOSTOMY;  Surgeon: Virl Cagey, MD;  Location: [facility] OR[facility];  Service: General;  Laterality: N/A;  .  None    . PARTIAL COLECTOMY N/A 09/17/2017   Procedure: SIGMOID  COLECTOMY;  Surgeon: Virl Cagey, MD;  Location: [facility];  Service: General;  Laterality: N/A;    Family History  Problem Relation Age of Onset  . Hypertension Father   . Lung cancer Father   . Colon cancer Neg Hx     Social History   Tobacco Use  . Smoking status: Current Every Day Smoker    Packs/day: 0.50    Years: 20.00    Pack years: 10.00    Types: Cigarettes  . Smokeless tobacco: Never Used  Substance Use Topics  . Alcohol use: Yes    Comment: 4x/week-3-4 beers, glasses of wine, or mixed drinks.  . Drug use: No    Medications: I have reviewed the patient's current medications. Prior to Admission:  (Not in a hospital admission) Allergies as of 11/07/2017   No Known Allergies     Medication List        Accurate as of 11/07/17 11:59 PM. Always use your most recent med list.          acetaminophen 500 MG tablet Commonly known as:  TYLENOL Take 500 mg every 6 (six) hours as needed by mouth for mild pain or moderate pain.   colchicine 0.6 MG tablet TAKE 2 TABLETS AT ONSET, THEN TAKE 1 TABLET 1 HOUR LATER. TAKE 1 TABLET TWICE DAILY FOR 2 MORE DAYS AS NEEDED FOR GOUT FLARES   docusate sodium  100 MG capsule Commonly known as:  COLACE Take 1 capsule (100 mg total) 2 (two) times daily by mouth.   indomethacin 50 MG capsule Commonly known as:  INDOCIN take 50 mg by mouth three times a day if needed gout flares   metroNIDAZOLE 500 MG tablet Commonly known as:  FLAGYL Take 1 tablet (500 mg total) by mouth as directed. Take 2 neomycin 510m tablets and 2 metronidazole 5031mtablets at 2 pm. Take 2 neomycin 50051mablets and 2 metronidazole 500m75mblets at 3pm. Take 2 neomycin 500mg59mlets and 2 metronidazole 500mg 66mets at 10pm.   neomycin 500 MG tablet Commonly known as:  MYCIFRADIN Take 2 tablets (1,000 mg total) by mouth as directed. Take 2 neomycin 500mg t25mts and 2 metronidazole  500mg ta40ms at 2 pm. Take 2 neomycin 500mg tab65m and 2 metronidazole 500mg tabl10mat 3pm. Take 2 neomycin 500mg table66mnd 2 metronidazole 500mg tablet66m 10pm.   ondansetron 4 MG disintegrating tablet Commonly known as:  ZOFRAN-ODT Take 1 tablet (4 mg total) every 6 (six) hours as needed by mouth for nausea.   oxyCODONE 5 MG immediate release tablet Commonly known as:  Oxy IR/ROXICODONE Take 1 tablet (5 mg total) every 4 (four) hours as needed by mouth for severe pain or breakthrough pain.        ROS:  A comprehensive review of systems was negative except for: ostomy in place, functioning, stool daily, no nausea/vomiting, no abdominal pain  Blood pressure (!) 171/98, pulse 89, temperature 99.3 F (37.4 C), resp. rate 18, height _0  (1.803 m), weight 194 lb (88 kg). Physical Exam  Constitutional: He is oriented to person, place, and time and well-developed, well-nourished, and in no distress.  HENT:  Head: Normocephalic.  Eyes: Pupils are equal, round, and reactive to light.  Neck: Normal range of motion.  Cardiovascular: Normal rate and regular rhythm.  Pulmonary/Chest: Effort normal and breath sounds normal.  Abdominal: Soft. He exhibits no distension. There is no tenderness.  Well healed midline, ostomy in place, pink with stool in bag  Musculoskeletal: Normal range of motion.  Neurological: He is alert and oriented to person, place, and time.  Skin: Skin is warm and dry.  Psychiatric: Mood, memory, affect and judgment normal.  Vitals reviewed.   Results: None  Assessment & Plan:  Steven Mcbride. male34s a very pleasant patient with and end colostomy s/p Hartman's for perforated diverticulitis/ colonic stricture 09/17/2017. He has healed great, and is ready to get his ostomy reversed.  He has had a colonoscopy that demonstrates no additional lesions.   -Barium enema to evaluate the length of the rectal stump, scheduled for 11/19/2017, if any issues  or concerns will call the patient  -OR for End colostomy reversal on 11/25/2017  -Bowel Prep Instructions: Fill your prescriptions for neomycin and metronidazole.  Get a bottle of Miralax (17 gram) over the counter. Get a large bottle of Gatorade 64 ounces (clear or blue color, not red).  Get a bottle of ducolax tablets over the counter. Get tap water enema kit over the counter.   The Day Prior to Surgery: Take 4 ducolax tablets at 7am with water. Do an enema through your rectum at 8AM. Drink plenty of clear liquids all day to avoid dehydration, no solid food.   Mix the bottle of Miralax and 64 oz of Gatorade and drink this mixture starting at 10am.  Drink it gradually over the next few hours, 8  ounces every 15-30 minutes until it is gone. Finish this by 2pm.  Repeat an enema at 2pm.  Take 2 neomycin 564m tablets and 2 metronidazole 5075mtablets at 2 pm. Take 2 neomycin 50019mablets and 2 metronidazole 500m77mblets at 3pm. Take 2 neomycin 500mg6mlets and 2 metronidazole 500mg 40mets at 10pm.   Do not eat or drink anything after midnight the night before your surgery.  Do not eat or drink anything that morning, and take medications as instructed by the hospital staff on your preoperative visit.   All questions were answered to the satisfaction of the patient.  The risk and benefits of reversal of his end colostomy were discussed including but not limited to bleeding, infection, need for bowel prep prior to the procedure, risk of anastomotic leak, possible need for ileostomy.  After careful consideration, MichaeAYRTON MCVAYecided to proceed.    LindsaVirl Cagey/2018, 10:57 AM

## 2017-11-07 NOTE — Patient Instructions (Addendum)
Fill your prescriptions for neomycin and metronidazole.  Get a bottle of Miralax (17 gram) over the counter. Get a large bottle of Gatorade 64 ounces (clear or blue color, not red).  Get a bottle of ducolax tablets over the counter. Get tap water enema kit over the counter.   The Day Prior to Surgery: Take 4 ducolax tablets at 7am with water. Do an enema through your rectum at 8AM. Drink plenty of clear liquids all day to avoid dehydration, no solid food.   Mix the bottle of Miralax and 64 oz of Gatorade and drink this mixture starting at 10am.  Drink it gradually over the next few hours, 8 ounces every 15-30 minutes until it is gone. Finish this by 2pm.  Repeat an enema at 2pm.  Take 2 neomycin '500mg'$  tablets and 2 metronidazole '500mg'$  tablets at 2 pm. Take 2 neomycin '500mg'$  tablets and 2 metronidazole '500mg'$  tablets at 3pm. Take 2 neomycin '500mg'$  tablets and 2 metronidazole '500mg'$  tablets at 10pm.    Do not eat or drink anything after midnight the night before your surgery.  Do not eat or drink anything that morning, and take medications as instructed by the hospital staff on your preoperative visit.    End Colostomy Reversal An end colostomy reversal is surgery that reverses an end colostomy. In this reversal procedure, the large intestine is disconnected from the opening in the abdomen (stoma). Then, it is reconnected to the rest of the large intestine inside the body. After this surgery, a stoma and colostomy bag are no longer needed. Stool (feces) can leave your body through the rectum, as it did before you had an end colostomy. Tell a health care provider about:  Any allergies you have.  All medicines you are taking, including vitamins, herbs, eye drops, creams, and over-the-counter medicines.  Any problems you or family members have had with anesthetic medicines.  Any blood disorders you have.  Any surgeries you have had.  Any medical conditions you have.  Whether you are  pregnant or may be pregnant. What are the risks? Generally, this is a safe procedure. However, problems may occur, including:  Infection.  Bleeding.  Allergic reactions to medicines.  Damage to other structures or organs.  A temporary condition in which the intestines stop moving and working correctly (ileus). This usually goes away in 3-7 days.  Leaking at the area of the intestine (anastomotic leak) where it was reconnected.  A collection of pus (abscess) in the abdomen or pelvis.  Intestinal blockage.  Narrowing of the intestine (stricture) at the place where it was reconnected.  Urinary and sexual dysfunction.  What happens before the procedure?  Follow instructions from your health care provider about eating or drinking restrictions.  Ask your health care provider about: ? Changing or stopping your regular medicines. This is especially important if you are taking diabetes medicines or blood thinners. ? Taking medicines such as aspirin and ibuprofen. These medicines can thin your blood. Do not take these medicines before your procedure if your health care provider instructs you not to.  Do not use any tobacco products, such as cigarettes, chewing tobacco, and e-cigarettes. If you need help quitting, ask your health care provider.  Plan to have someone take you home after the procedure.  You may have an exam or testing.  Ask your health care provider how your surgical site will be marked or identified.  You may be given antibiotic medicine to help prevent infection. What happens during the  procedure?  To reduce your risk of infection: ? Your health care team will wash or sanitize their hands. ? Your skin will be washed with soap.  An IV tube will be inserted into one of your veins.  You may be given a medicine to help you relax (sedative).  You will be given a medicine to make you fall asleep (general anesthetic).  An incision will be made in your abdomen at  the site of the stoma.  The large intestine will be disconnected from the abdomen at the site of the stoma.  The surgeon will use stitches (sutures) or staples to reconnect the two ends of the intestine that were separated during the end colostomy.  The incision will be closed with sutures, skin glue, or adhesive strips. It may be covered with bandages (dressings). The procedure may vary among health care providers and hospitals. What happens after the procedure?  Your blood pressure, heart rate, breathing rate, and blood oxygen level will be monitored often until the medicines you were given have worn off.  You will be given pain medicine as needed.  You will slowly increase your diet and movement as told by your health care provider.  Do not drive for 24 hours if you received a sedative. This information is not intended to replace advice given to you by your health care provider. Make sure you discuss any questions you have with your health care provider. Document Released: 01/21/2012 Document Revised: 04/05/2016 Document Reviewed: 07/12/2015 Elsevier Interactive Patient Education  2018 [employer].  Barium Enema A barium enema is sometimes called a lower gastrointestinal (GI) series. It is a medical test in which X-rays are taken to check for problems in the colon or in the final part of the small bowel. The colon is also called the large intestine. This exam can be used to check for polyps, tumors, abnormal pouches (diverticula), Crohn disease, inflammatory bowel disease, or other problems. It can help to find the cause of symptoms such as abdominal pain or blood in the stool. For this exam, a white chalky liquid called barium will be put into your colon through the anus (enema). A series of X-rays will be done while the barium passes through your colon. The barium shows up well on X-rays, making it easier for your health care provider to see possible problems. Tell a health care  provider about: Any allergies you have. All medicines you are taking, including vitamins, herbs, eye drops, creams, and over-the-counter medicines. Any blood disorders you have. Any surgeries you have had. Any medical conditions you have, including any rectal pain or any new or increased blood in your stool. Whether you are pregnant or may be pregnant. What are the risks? Generally, this is a safe procedure. However, problems may occur, including: Constipation or fecal impaction. A hole (perforation) in the colon.  What happens before the procedure? Follow instructions from your health care provider about eating or drinking restrictions. Ask your health care provider about: Changing or stopping your regular medicines. This is especially important if you are taking diabetes medicines or blood thinners. Taking medicines such as aspirin and ibuprofen. These medicines can thin your blood. Do not take these medicines before your procedure if your health care provider instructs you not to. Follow instructions from your health care provider about bowel preparation. This may include: Drinking a large amount of medicated liquid, starting the day before your procedure. The liquid will cause you to have multiple loose bowel movements until  your stool is almost clear or light green. This cleans out your colon in preparation for the procedure. Using a suppository or an enema. What happens during the procedure? A small tube (rectal catheter) will be inserted into your rectum. A balloon on the catheter will be inflated so that it blocks the anal opening. This will help to keep the barium in the colon during the test. Barium will be put into the colon through the catheter. Using a series of plain X-rays as well as a type of X-ray called fluoroscopy, the health care provider will watch the flow of barium as it moves through the rectum, the colon, and into the last part of the small bowel. You may need to  shift your position during the exam. In some cases, air will be put into the colon through the catheter. This helps to get even better X-rays if necessary. The barium will be drained out of the colon. You will be asked to go to the bathroom to expel more barium. A final X-ray will be taken. The procedure may vary among health care providers and hospitals. What happens after the procedure? Do not drive for 24 hours if you received a medicine to make you relax (sedative). Return to your normal activities and diet as told by your health care provider. It is your responsibility to get your test results. Ask your health care provider or the department performing the test when your results will be ready. This information is not intended to replace advice given to you by your health care provider. Make sure you discuss any questions you have with your health care provider. Document Released: 10/26/2000 Document Revised: 04/05/2016 Document Reviewed: 05/13/2015 Elsevier Interactive Patient Education  [employer].

## 2017-11-08 DIAGNOSIS — K5732 Diverticulitis of large intestine without perforation or abscess without bleeding: Secondary | ICD-10-CM | POA: Diagnosis not present

## 2017-11-08 DIAGNOSIS — Z933 Colostomy status: Secondary | ICD-10-CM | POA: Diagnosis not present

## 2017-11-09 ENCOUNTER — Encounter: Payer: Self-pay | Admitting: General Surgery

## 2017-11-09 NOTE — H&P (Signed)
[facility] History and Physical  End Colostomy in Place  Chief Complaint    Follow-up      Steven Mcbride is a 46 y.o. male.  HPI: Steven Mcbride is well known to me following a Hartman's procedure 11/6 for perforated diverticulitis with colonic stricture. He had underwent a colonoscopy the day prior and presented to the hospital with severe abdominal pain and findings of some free air.  The colonoscopy did get to evaluate the entire colon as they were able to traverse the stricture.  He did well post operatively, and has been healing since that time. He has had a great holiday and has been back to work. He is ready to get his ostomy reversed at this time.  He denies any issues with the ostomy and is able to care for the bag without problems.  He has a well healed midline scar, and is feeling stronger everyday.  He is active and has no complaints of any pain, fever, or chill.   Past Medical History:  Diagnosis Date  . Diverticulitis   . Gout     Past Surgical History:  Procedure Laterality Date  . BIOPSY  09/16/2017   Procedure: BIOPSY;  Surgeon: Rourk, Robert M, MD;  Location: [facility];  Service: Endoscopy;;  sigmoid colon;  . COLONOSCOPY  09/15/2017   Dr. Rourk: scattered small and large-mouthed diverticula were found in sigmoid and descending colon. Benign-appearing, intrinsic severe stenosis in sigmoid colon that was chronically inflamed appearing, fibrotic, hypertrophied mucosa involving 15 cm segment of proximal sigmoid colon. Produced significant narrowing. Upstream in colon was formed and liquid stool, precluding entire evaluation.   . COLONOSCOPY WITH PROPOFOL N/A 09/16/2017   Procedure: COLONOSCOPY WITH PROPOFOL;  Surgeon: Rourk, Robert M, MD;  Location: [facility];  Service: Endoscopy;  Laterality: N/A;  9:15am  . COLOSTOMY N/A 09/17/2017   Procedure: COLOSTOMY;  Surgeon: Bridges, Lindsay C, MD;  Location: [facility];  Service: General;  Laterality: N/A;  .  None    . PARTIAL COLECTOMY N/A 09/17/2017   Procedure: SIGMOID  COLECTOMY;  Surgeon: Bridges, Lindsay C, MD;  Location: [facility];  Service: General;  Laterality: N/A;    Family History  Problem Relation Age of Onset  . Hypertension Father   . Lung cancer Father   . Colon cancer Neg Hx     Social History   Tobacco Use  . Smoking status: Current Every Day Smoker    Packs/day: 0.50    Years: 20.00    Pack years: 10.00    Types: Cigarettes  . Smokeless tobacco: Never Used  Substance Use Topics  . Alcohol use: Yes    Comment: 4x/week-3-4 beers, glasses of wine, or mixed drinks.  . Drug use: No    Medications: I have reviewed the patient's current medications. Prior to Admission:  (Not in a hospital admission) Allergies as of 11/07/2017   No Known Allergies     Medication List        Accurate as of 11/07/17 11:59 PM. Always use your most recent med list.          acetaminophen 500 MG tablet Commonly known as:  TYLENOL Take 500 mg every 6 (six) hours as needed by mouth for mild pain or moderate pain.   colchicine 0.6 MG tablet TAKE 2 TABLETS AT ONSET, THEN TAKE 1 TABLET 1 HOUR LATER. TAKE 1 TABLET TWICE DAILY FOR 2 MORE DAYS AS NEEDED FOR GOUT FLARES   docusate sodium   100 MG capsule Commonly known as:  COLACE Take 1 capsule (100 mg total) 2 (two) times daily by mouth.   indomethacin 50 MG capsule Commonly known as:  INDOCIN take 50 mg by mouth three times a day if needed gout flares   metroNIDAZOLE 500 MG tablet Commonly known as:  FLAGYL Take 1 tablet (500 mg total) by mouth as directed. Take 2 neomycin 500mg tablets and 2 metronidazole 500mg tablets at 2 pm. Take 2 neomycin 500mg tablets and 2 metronidazole 500mg tablets at 3pm. Take 2 neomycin 500mg tablets and 2 metronidazole 500mg tablets at 10pm.   neomycin 500 MG tablet Commonly known as:  MYCIFRADIN Take 2 tablets (1,000 mg total) by mouth as directed. Take 2 neomycin 500mg tablets and 2 metronidazole  500mg tablets at 2 pm. Take 2 neomycin 500mg tablets and 2 metronidazole 500mg tablets at 3pm. Take 2 neomycin 500mg tablets and 2 metronidazole 500mg tablets at 10pm.   ondansetron 4 MG disintegrating tablet Commonly known as:  ZOFRAN-ODT Take 1 tablet (4 mg total) every 6 (six) hours as needed by mouth for nausea.   oxyCODONE 5 MG immediate release tablet Commonly known as:  Oxy IR/ROXICODONE Take 1 tablet (5 mg total) every 4 (four) hours as needed by mouth for severe pain or breakthrough pain.        ROS:  A comprehensive review of systems was negative except for: ostomy in place, functioning, stool daily, no nausea/vomiting, no abdominal pain  Blood pressure (!) 171/98, pulse 89, temperature 99.3 F (37.4 C), resp. rate 18, height 5' 11" (1.803 m), weight 194 lb (88 kg). Physical Exam  Constitutional: He is oriented to person, place, and time and well-developed, well-nourished, and in no distress.  HENT:  Head: Normocephalic.  Eyes: Pupils are equal, round, and reactive to light.  Neck: Normal range of motion.  Cardiovascular: Normal rate and regular rhythm.  Pulmonary/Chest: Effort normal and breath sounds normal.  Abdominal: Soft. He exhibits no distension. There is no tenderness.  Well healed midline, ostomy in place, pink with stool in bag  Musculoskeletal: Normal range of motion.  Neurological: He is alert and oriented to person, place, and time.  Skin: Skin is warm and dry.  Psychiatric: Mood, memory, affect and judgment normal.  Vitals reviewed.   Results: None  Assessment & Plan:  Steven Mcbride is a 46 y.o. 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 He has healed great, and is ready to get his ostomy reversed.  He has had a colonoscopy that demonstrates no additional lesions.   -Barium enema to evaluate the length of the rectal stump, scheduled for 11/19/2017, if any issues  or concerns will call the patient  -OR for End colostomy reversal on 11/25/2017  -Bowel Prep Instructions: Fill your prescriptions for neomycin and metronidazole.  Get a bottle of Miralax (17 gram) over the counter. Get a large bottle of Gatorade 64 ounces (clear or blue color, not red).  Get a bottle of ducolax tablets over the counter. Get tap water enema kit over the counter.   The Day Prior to Surgery: Take 4 ducolax tablets at 7am with water. Do an enema through your rectum at 8AM. Drink plenty of clear liquids all day to avoid dehydration, no solid food.   Mix the bottle of Miralax and 64 oz of Gatorade and drink this mixture starting at 10am.  Drink it gradually over the next few hours, 8   ounces every 15-30 minutes until it is gone. Finish this by 2pm.  Repeat an enema at 2pm.  Take 2 neomycin 500mg tablets and 2 metronidazole 500mg tablets at 2 pm. Take 2 neomycin 500mg tablets and 2 metronidazole 500mg tablets at 3pm. Take 2 neomycin 500mg tablets and 2 metronidazole 500mg tablets at 10pm.   Do not eat or drink anything after midnight the night before your surgery.  Do not eat or drink anything that morning, and take medications as instructed by the hospital staff on your preoperative visit.   All questions were answered to the satisfaction of the patient.  The risk and benefits of reversal of his end colostomy were discussed including but not limited to bleeding, infection, need for bowel prep prior to the procedure, risk of anastomotic leak, possible need for ileostomy.  After careful consideration, Steven Mcbride has decided to proceed.    Lindsay C Bridges 11/09/2017, 10:57 AM    

## 2017-11-18 NOTE — Patient Instructions (Signed)
Steven Mcbride  11/18/2017     @PREFPERIOPPHARMACY @   Your procedure is scheduled on  11/25/2017   Report to [facility] at  615   A.M.  Call this number if you have problems the morning of surgery:  [phone]   Remember:  Do not eat food or drink liquids after midnight.  Take these medicines the morning of surgery with A SIP OF WATER  Colchicine, Indocin(if needed).   Do not wear jewelry, make-up or nail polish.  Do not wear lotions, powders, or perfumes, or deodorant.  Do not shave 48 hours prior to surgery.  Men may shave face and neck.  Do not bring valuables to the hospital.  [facility] is not responsible for any belongings or valuables.  Contacts, dentures or bridgework may not be worn into surgery.  Leave your suitcase in the car.  After surgery it may be brought to your room.  For patients admitted to the hospital, discharge time will be determined by your treatment team.  Patients discharged the day of surgery will not be allowed to drive home.   Name and phone number of your driver:   family Special instructions:  Follow any diet and prep instructions given to you by Dr Henreitta Leber  Please read over the following fact sheets that you were given. Pain Booklet, Coughing and Deep Breathing, Blood Transfusion Information, Surgical Site Infection Prevention, Anesthesia Post-op Instructions and Care and Recovery After Surgery       End Colostomy Reversal An end colostomy reversal is surgery that reverses an end colostomy. In this reversal procedure, the large intestine is disconnected from the opening in the abdomen (stoma). Then, it is reconnected to the rest of the large intestine inside the body. After this surgery, a stoma and colostomy bag are no longer needed. Stool (feces) can leave your body through the rectum, as it did before you had an end colostomy. Tell a health care provider about:  Any allergies you have.  All medicines you are taking,  including vitamins, herbs, eye drops, creams, and over-the-counter medicines.  Any problems you or family members have had with anesthetic medicines.  Any blood disorders you have.  Any surgeries you have had.  Any medical conditions you have.  Whether you are pregnant or may be pregnant. What are the risks? Generally, this is a safe procedure. However, problems may occur, including:  Infection.  Bleeding.  Allergic reactions to medicines.  Damage to other structures or organs.  A temporary condition in which the intestines stop moving and working correctly (ileus). This usually goes away in 3-7 days.  Leaking at the area of the intestine (anastomotic leak) where it was reconnected.  A collection of pus (abscess) in the abdomen or pelvis.  Intestinal blockage.  Narrowing of the intestine (stricture) at the place where it was reconnected.  Urinary and sexual dysfunction.  What happens before the procedure?  Follow instructions from your health care provider about eating or drinking restrictions.  Ask your health care provider about: ? Changing or stopping your regular medicines. This is especially important if you are taking diabetes medicines or blood thinners. ? Taking medicines such as aspirin and ibuprofen. These medicines can thin your blood. Do not take these medicines before your procedure if your health care provider instructs you not to.  Do not use any tobacco products, such as cigarettes, chewing tobacco, and e-cigarettes. If you need help quitting, ask  your health care provider.  Plan to have someone take you home after the procedure.  You may have an exam or testing.  Ask your health care provider how your surgical site will be marked or identified.  You may be given antibiotic medicine to help prevent infection. What happens during the procedure?  To reduce your risk of infection: ? Your health care team will wash or sanitize their hands. ? Your  skin will be washed with soap.  An IV tube will be inserted into one of your veins.  You may be given a medicine to help you relax (sedative).  You will be given a medicine to make you fall asleep (general anesthetic).  An incision will be made in your abdomen at the site of the stoma.  The large intestine will be disconnected from the abdomen at the site of the stoma.  The surgeon will use stitches (sutures) or staples to reconnect the two ends of the intestine that were separated during the end colostomy.  The incision will be closed with sutures, skin glue, or adhesive strips. It may be covered with bandages (dressings). The procedure may vary among health care providers and hospitals. What happens after the procedure?  Your blood pressure, heart rate, breathing rate, and blood oxygen level will be monitored often until the medicines you were given have worn off.  You will be given pain medicine as needed.  You will slowly increase your diet and movement as told by your health care provider.  Do not drive for 24 hours if you received a sedative. This information is not intended to replace advice given to you by your health care provider. Make sure you discuss any questions you have with your health care provider. Document Released: 01/21/2012 Document Revised: 04/05/2016 Document Reviewed: 07/12/2015 Elsevier Interactive Patient Education  2018 [employer]  End Colostomy Reversal, Care After Refer to this sheet in the next few weeks. These instructions provide you with information about caring for yourself after your procedure. Your health care provider may also give you more specific instructions. Your treatment has been planned according to current medical practices, but problems sometimes occur. Call your health care provider if you have any problems or questions after your procedure. What can I expect after the procedure? After the procedure, it is common to have:  Pain  and discomfort in your abdomen, especially near your incision.  Loose stools.  Decreased appetite.  Follow these instructions at home: Activity  Return to your normal activities as told by your health care provider. Ask your health care provider what activities are safe for you.  Avoid strenuous activity, contact sports, and abdominal exercises for 4 weeks or as long as told by your health care provider.  Do not lift anything that is heavier than 10 lb (4.5 kg). Incision care   Follow instructions from your health care provider about how to take care of your incision. Make sure you: ? Wash your hands with soap and water before you change your bandage (dressing). If soap and water are not available, use hand sanitizer. ? Change your dressing as told by your health care provider. ? Leave stitches (sutures), skin glue, or adhesive strips in place. These skin closures may need to stay in place for 2 weeks or longer. If adhesive strip edges start to loosen and curl up, you may trim the loose edges. Do not remove adhesive strips completely unless your health care provider tells you to do that.  Keep  the incision area clean and dry.  Check your incision area every day for signs of infection. Check for: ? More redness, swelling, or pain. ? More fluid or blood. ? Warmth. ? Pus or a bad smell. Bathing  Do not take baths, swim, or use a hot tub until your health care provider approves. Ask your health care provider if you can take showers. You may only be allowed to take sponge baths for bathing.  If your health care provider approves bathing and showering, cover the bandage with a watertight plastic bag to protect it from water. Do not let the bandage get wet.  Keep the bandage (dressing) dry until your health care provider says it can be removed. Eating and drinking  Follow instructions from your health care provider about eating or drinking restrictions.  Drink enough fluid to keep  your urine clear or pale yellow. Driving  Do not drive for 24 hours if you received a sedative.  Do not drive or operate heavy machinery while taking prescription pain medicine. General instructions  Take over-the-counter and prescription medicines only as told by your health care provider.  Do not use any tobacco products, such as cigarettes, chewing tobacco, and e-cigarettes. If you need help quitting, ask your health care provider.  Keep all follow-up visits as told by your health care provider. This is important. Contact a health care provider if:  You havemore redness, swelling, or pain at the site of your incision.  You have more fluid or blood coming from your incision.  Your incision feels warm to the touch.  You have pus or a bad smell coming from your incision.  You have a fever.  Your incision breaks open.  You keep vomiting or feeling nauseous.  You are not able to have a bowel movement (are constipated).  You have pain that is not controlled with medicine. Get help right away if:  You have a rash.  You have difficulty breathing. This information is not intended to replace advice given to you by your health care provider. Make sure you discuss any questions you have with your health care provider. Document Released: 01/21/2012 Document Revised: 04/05/2016 Document Reviewed: 07/12/2015 Elsevier Interactive Patient Education  2018 [employer]  General Anesthesia, Adult General anesthesia is the use of medicines to make a person "go to sleep" (be unconscious) for a medical procedure. General anesthesia is often recommended when a procedure:  Is long.  Requires you to be still or in an unusual position.  Is major and can cause you to lose blood.  Is impossible to do without general anesthesia.  The medicines used for general anesthesia are called general anesthetics. In addition to making you sleep, the medicines:  Prevent pain.  Control your blood  pressure.  Relax your muscles.  Tell a health care provider about:  Any allergies you have.  All medicines you are taking, including vitamins, herbs, eye drops, creams, and over-the-counter medicines.  Any problems you or family members have had with anesthetic medicines.  Types of anesthetics you have had in the past.  Any bleeding disorders you have.  Any surgeries you have had.  Any medical conditions you have.  Any history of heart or lung conditions, such as heart failure, sleep apnea, or chronic obstructive pulmonary disease (COPD).  Whether you are pregnant or may be pregnant.  Whether you use tobacco, alcohol, marijuana, or street drugs.  Any history of Financial plannermilitary service.  Any history of depression or anxiety. What are  the risks? Generally, this is a safe procedure. However, problems may occur, including:  Allergic reaction to anesthetics.  Lung and heart problems.  Inhaling food or liquids from your stomach into your lungs (aspiration).  Injury to nerves.  Waking up during your procedure and being unable to move (rare).  Extreme agitation or a state of mental confusion (delirium) when you wake up from the anesthetic.  Air in the bloodstream, which can lead to stroke.  These problems are more likely to develop if you are having a major surgery or if you have an advanced medical condition. You can prevent some of these complications by answering all of your health care provider's questions thoroughly and by following all pre-procedure instructions. General anesthesia can cause side effects, including:  Nausea or vomiting  A sore throat from the breathing tube.  Feeling cold or shivery.  Feeling tired, washed out, or achy.  Sleepiness or drowsiness.  Confusion or agitation.  What happens before the procedure? Staying hydrated Follow instructions from your health care provider about hydration, which may include:  Up to 2 hours before the procedure  - you may continue to drink clear liquids, such as water, clear fruit juice, black coffee, and plain tea.  Eating and drinking restrictions Follow instructions from your health care provider about eating and drinking, which may include:  8 hours before the procedure - stop eating heavy meals or foods such as meat, fried foods, or fatty foods.  6 hours before the procedure - stop eating light meals or foods, such as toast or cereal.  6 hours before the procedure - stop drinking milk or drinks that contain milk.  2 hours before the procedure - stop drinking clear liquids.  Medicines  Ask your health care provider about: ? Changing or stopping your regular medicines. This is especially important if you are taking diabetes medicines or blood thinners. ? Taking medicines such as aspirin and ibuprofen. These medicines can thin your blood. Do not take these medicines before your procedure if your health care provider instructs you not to. ? Taking new dietary supplements or medicines. Do not take these during the week before your procedure unless your health care provider approves them.  If you are told to take a medicine or to continue taking a medicine on the day of the procedure, take the medicine with sips of water. General instructions   Ask if you will be going home the same day, the following day, or after a longer hospital stay. ? Plan to have someone take you home. ? Plan to have someone stay with you for the first 24 hours after you leave the hospital or clinic.  For 3-6 weeks before the procedure, try not to use any tobacco products, such as cigarettes, chewing tobacco, and e-cigarettes.  You may brush your teeth on the morning of the procedure, but make sure to spit out the toothpaste. What happens during the procedure?  You will be given anesthetics through a mask and through an IV tube in one of your veins.  You may receive medicine to help you relax (sedative).  As soon  as you are asleep, a breathing tube may be used to help you breathe.  An anesthesia specialist will stay with you throughout the procedure. He or she will help keep you comfortable and safe by continuing to give you medicines and adjusting the amount of medicine that you get. He or she will also watch your blood pressure, pulse, and oxygen levels  to make sure that the anesthetics do not cause any problems.  If a breathing tube was used to help you breathe, it will be removed before you wake up. The procedure may vary among health care providers and hospitals. What happens after the procedure?  You will wake up, often slowly, after the procedure is complete, usually in a recovery area.  Your blood pressure, heart rate, breathing rate, and blood oxygen level will be monitored until the medicines you were given have worn off.  You may be given medicine to help you calm down if you feel anxious or agitated.  If you will be going home the same day, your health care provider may check to make sure you can stand, drink, and urinate.  Your health care providers will treat your pain and side effects before you go home.  Do not drive for 24 hours if you received a sedative.  You may: ? Feel nauseous and vomit. ? Have a sore throat. ? Have mental slowness. ? Feel cold or shivery. ? Feel sleepy. ? Feel tired. ? Feel sore or achy, even in parts of your body where you did not have surgery. This information is not intended to replace advice given to you by your health care provider. Make sure you discuss any questions you have with your health care provider. Document Released: 02/05/2008 Document Revised: 04/10/2016 Document Reviewed: 10/13/2015 Elsevier Interactive Patient Education  2018 [employer]. General Anesthesia, Adult, Care After These instructions provide you with information about caring for yourself after your procedure. Your health care provider may also give you more specific  instructions. Your treatment has been planned according to current medical practices, but problems sometimes occur. Call your health care provider if you have any problems or questions after your procedure. What can I expect after the procedure? After the procedure, it is common to have:  Vomiting.  A sore throat.  Mental slowness.  It is common to feel:  Nauseous.  Cold or shivery.  Sleepy.  Tired.  Sore or achy, even in parts of your body where you did not have surgery.  Follow these instructions at home: For at least 24 hours after the procedure:  Do not: ? Participate in activities where you could fall or become injured. ? Drive. ? Use heavy machinery. ? Drink alcohol. ? Take sleeping pills or medicines that cause drowsiness. ? Make important decisions or sign legal documents. ? Take care of children on your own.  Rest. Eating and drinking  If you vomit, drink water, juice, or soup when you can drink without vomiting.  Drink enough fluid to keep your urine clear or pale yellow.  Make sure you have little or no nausea before eating solid foods.  Follow the diet recommended by your health care provider. General instructions  Have a responsible adult stay with you until you are awake and alert.  Return to your normal activities as told by your health care provider. Ask your health care provider what activities are safe for you.  Take over-the-counter and prescription medicines only as told by your health care provider.  If you smoke, do not smoke without supervision.  Keep all follow-up visits as told by your health care provider. This is important. Contact a health care provider if:  You continue to have nausea or vomiting at home, and medicines are not helpful.  You cannot drink fluids or start eating again.  You cannot urinate after 8-12 hours.  You develop a skin rash.  You have fever.  You have increasing redness at the site of your  procedure. Get help right away if:  You have difficulty breathing.  You have chest pain.  You have unexpected bleeding.  You feel that you are having a life-threatening or urgent problem. This information is not intended to replace advice given to you by your health care provider. Make sure you discuss any questions you have with your health care provider. Document Released: 02/04/2001 Document Revised: 04/02/2016 Document Reviewed: 10/13/2015 Elsevier Interactive Patient Education  [employer].

## 2017-11-19 ENCOUNTER — Other Ambulatory Visit: Payer: Self-pay | Admitting: General Surgery

## 2017-11-19 ENCOUNTER — Ambulatory Visit (HOSPITAL_COMMUNITY)
Admission: RE | Admit: 2017-11-19 | Discharge: 2017-11-19 | Disposition: A | Discharge status: Home / Self Care | Payer: BLUE CROSS/BLUE SHIELD | Source: Ambulatory Visit | Attending: General Surgery | Admitting: General Surgery

## 2017-11-19 ENCOUNTER — Other Ambulatory Visit (HOSPITAL_COMMUNITY): Payer: BLUE CROSS/BLUE SHIELD

## 2017-11-19 ENCOUNTER — Encounter (HOSPITAL_COMMUNITY): Payer: Self-pay

## 2017-11-19 DIAGNOSIS — Z933 Colostomy status: Secondary | ICD-10-CM | POA: Insufficient documentation

## 2017-11-19 DIAGNOSIS — Z433 Encounter for attention to colostomy: Secondary | ICD-10-CM | POA: Diagnosis not present

## 2017-11-19 DIAGNOSIS — K9400 Colostomy complication, unspecified: Secondary | ICD-10-CM

## 2017-11-21 ENCOUNTER — Encounter (HOSPITAL_COMMUNITY)
Admission: RE | Admit: 2017-11-21 | Discharge: 2017-11-21 | Disposition: A | Discharge status: Home / Self Care | Payer: BLUE CROSS/BLUE SHIELD | Source: Ambulatory Visit | Attending: General Surgery | Admitting: General Surgery

## 2017-11-21 ENCOUNTER — Encounter (HOSPITAL_COMMUNITY): Payer: Self-pay

## 2017-11-21 ENCOUNTER — Other Ambulatory Visit: Payer: Self-pay

## 2017-11-21 DIAGNOSIS — Z01810 Encounter for preprocedural cardiovascular examination: Secondary | ICD-10-CM | POA: Diagnosis not present

## 2017-11-21 DIAGNOSIS — Z01812 Encounter for preprocedural laboratory examination: Secondary | ICD-10-CM | POA: Insufficient documentation

## 2017-11-21 LAB — CBC WITH DIFFERENTIAL/PLATELET
Basophils Absolute: 0.1 10*3/uL (ref 0.0–0.1)
Basophils Relative: 1 %
Eosinophils Absolute: 0.1 10*3/uL (ref 0.0–0.7)
Eosinophils Relative: 1 %
HEMATOCRIT: 44.4 % (ref 39.0–52.0)
HEMOGLOBIN: 14.6 g/dL (ref 13.0–17.0)
LYMPHS PCT: 29 %
Lymphs Abs: 2.8 10*3/uL (ref 0.7–4.0)
MCH: 29.7 pg (ref 26.0–34.0)
MCHC: 32.9 g/dL (ref 30.0–36.0)
MCV: 90.2 fL (ref 78.0–100.0)
MONOS PCT: 10 %
Monocytes Absolute: 0.9 10*3/uL (ref 0.1–1.0)
NEUTROS PCT: 59 %
Neutro Abs: 5.8 10*3/uL (ref 1.7–7.7)
Platelets: 300 10*3/uL (ref 150–400)
RBC: 4.92 MIL/uL (ref 4.22–5.81)
RDW: 13.6 % (ref 11.5–15.5)
WBC: 9.7 10*3/uL (ref 4.0–10.5)

## 2017-11-21 LAB — BASIC METABOLIC PANEL
Anion gap: 12 (ref 5–15)
BUN: 11 mg/dL (ref 6–20)
CHLORIDE: 100 mmol/L — AB (ref 101–111)
CO2: 22 mmol/L (ref 22–32)
CREATININE: 0.78 mg/dL (ref 0.61–1.24)
Calcium: 9.1 mg/dL (ref 8.9–10.3)
GFR calc non Af Amer: >60 mL/min (ref >60)
Glucose, Bld: 90 mg/dL (ref 65–99)
Potassium: 3.2 mmol/L — ABNORMAL LOW (ref 3.5–5.1)
Sodium: 134 mmol/L — ABNORMAL LOW (ref 135–145)

## 2017-11-21 LAB — TYPE AND SCREEN
ABO/RH(D): O POS
ANTIBODY SCREEN: NEGATIVE

## 2017-11-22 NOTE — Pre-Procedure Instructions (Signed)
Potassium of 3.2 called to Dr Henreitta LeberBridges.

## 2017-11-25 ENCOUNTER — Inpatient Hospital Stay (HOSPITAL_COMMUNITY): Payer: BLUE CROSS/BLUE SHIELD | Admitting: Anesthesiology

## 2017-11-25 ENCOUNTER — Other Ambulatory Visit: Payer: Self-pay

## 2017-11-25 ENCOUNTER — Encounter (HOSPITAL_COMMUNITY)
Admission: RE | Disposition: A | Discharge status: Home / Self Care | Payer: Self-pay | Source: Ambulatory Visit | Attending: General Surgery

## 2017-11-25 ENCOUNTER — Inpatient Hospital Stay (HOSPITAL_COMMUNITY)
Admission: RE | Admit: 2017-11-25 | Discharge: 2017-11-29 | DRG: 330 | Disposition: A | Discharge status: Home / Self Care | Payer: BLUE CROSS/BLUE SHIELD | Source: Ambulatory Visit | Attending: General Surgery | Admitting: General Surgery

## 2017-11-25 ENCOUNTER — Encounter (HOSPITAL_COMMUNITY): Payer: Self-pay | Admitting: *Deleted

## 2017-11-25 DIAGNOSIS — Z433 Encounter for attention to colostomy: Principal | ICD-10-CM

## 2017-11-25 DIAGNOSIS — F17210 Nicotine dependence, cigarettes, uncomplicated: Secondary | ICD-10-CM | POA: Diagnosis not present

## 2017-11-25 DIAGNOSIS — M109 Gout, unspecified: Secondary | ICD-10-CM | POA: Diagnosis present

## 2017-11-25 DIAGNOSIS — K660 Peritoneal adhesions (postprocedural) (postinfection): Secondary | ICD-10-CM | POA: Diagnosis not present

## 2017-11-25 DIAGNOSIS — K529 Noninfective gastroenteritis and colitis, unspecified: Secondary | ICD-10-CM | POA: Diagnosis not present

## 2017-11-25 DIAGNOSIS — K567 Ileus, unspecified: Secondary | ICD-10-CM | POA: Diagnosis not present

## 2017-11-25 DIAGNOSIS — I10 Essential (primary) hypertension: Secondary | ICD-10-CM | POA: Diagnosis not present

## 2017-11-25 DIAGNOSIS — K5732 Diverticulitis of large intestine without perforation or abscess without bleeding: Secondary | ICD-10-CM | POA: Diagnosis not present

## 2017-11-25 DIAGNOSIS — K5730 Diverticulosis of large intestine without perforation or abscess without bleeding: Secondary | ICD-10-CM | POA: Diagnosis not present

## 2017-11-25 DIAGNOSIS — Z933 Colostomy status: Secondary | ICD-10-CM | POA: Diagnosis not present

## 2017-11-25 HISTORY — PX: COLOSTOMY REVERSAL: SHX5782

## 2017-11-25 LAB — POCT I-STAT 4, (NA,K, GLUC, HGB,HCT)
GLUCOSE: 98 mg/dL (ref 65–99)
HCT: 43 % (ref 39.0–52.0)
Hemoglobin: 14.6 g/dL (ref 13.0–17.0)
POTASSIUM: 3.7 mmol/L (ref 3.5–5.1)
SODIUM: 141 mmol/L (ref 135–145)

## 2017-11-25 SURGERY — COLOSTOMY REVERSAL
Anesthesia: General

## 2017-11-25 MED ORDER — PROMETHAZINE HCL 25 MG/ML IJ SOLN
INTRAMUSCULAR | Status: AC
  Filled 2017-11-25: qty 1

## 2017-11-25 MED ORDER — LIDOCAINE HCL (PF) 1 % IJ SOLN
INTRAMUSCULAR | Status: AC
  Filled 2017-11-25: qty 5

## 2017-11-25 MED ORDER — FENTANYL CITRATE (PF) 250 MCG/5ML IJ SOLN
INTRAMUSCULAR | Status: AC
  Filled 2017-11-25: qty 5

## 2017-11-25 MED ORDER — GABAPENTIN 300 MG PO CAPS
300.0000 mg | ORAL_CAPSULE | ORAL | Status: AC
  Administered 2017-11-25: 300 mg via ORAL
  Filled 2017-11-25: qty 1

## 2017-11-25 MED ORDER — SUGAMMADEX SODIUM 500 MG/5ML IV SOLN
INTRAVENOUS | Status: AC
  Filled 2017-11-25: qty 5

## 2017-11-25 MED ORDER — ONDANSETRON HCL 4 MG/2ML IJ SOLN: 4.0000 mg | Freq: Four times a day (QID) | INTRAMUSCULAR | Status: DC | PRN

## 2017-11-25 MED ORDER — SODIUM CHLORIDE 0.9 % IJ SOLN
INTRAMUSCULAR | Status: AC
  Filled 2017-11-25: qty 10

## 2017-11-25 MED ORDER — ONDANSETRON HCL 4 MG/2ML IJ SOLN
4.0000 mg | Freq: Once | INTRAMUSCULAR | Status: AC
Start: 2017-11-25 — End: 2017-11-25
  Administered 2017-11-25: 4 mg via INTRAVENOUS

## 2017-11-25 MED ORDER — HYDROMORPHONE 1 MG/ML IV SOLN
INTRAVENOUS | Status: DC
  Administered 2017-11-25: 16:00:00 via INTRAVENOUS
  Administered 2017-11-25: 0.6 mg via INTRAVENOUS
  Administered 2017-11-26: 2.6 mg via INTRAVENOUS
  Administered 2017-11-26: 0.8 mg via INTRAVENOUS
  Administered 2017-11-26: 3.2 mg via INTRAVENOUS
  Administered 2017-11-26: 0.6 mg via INTRAVENOUS
  Filled 2017-11-25: qty 25

## 2017-11-25 MED ORDER — OXYCODONE HCL 5 MG PO TABS: 5.0000 mg | ORAL_TABLET | ORAL | Status: DC | PRN

## 2017-11-25 MED ORDER — LABETALOL HCL 5 MG/ML IV SOLN
10.0000 mg | INTRAVENOUS | Status: AC | PRN
  Administered 2017-11-25 (×2): 10 mg via INTRAVENOUS

## 2017-11-25 MED ORDER — FENTANYL CITRATE (PF) 100 MCG/2ML IJ SOLN
INTRAMUSCULAR | Status: DC | PRN
  Administered 2017-11-25 (×4): 50 ug via INTRAVENOUS
  Administered 2017-11-25: 100 ug via INTRAVENOUS
  Administered 2017-11-25 (×4): 50 ug via INTRAVENOUS

## 2017-11-25 MED ORDER — EPHEDRINE SULFATE 50 MG/ML IJ SOLN
INTRAMUSCULAR | Status: AC
  Filled 2017-11-25: qty 1

## 2017-11-25 MED ORDER — ACETAMINOPHEN 500 MG PO TABS
1000.0000 mg | ORAL_TABLET | Freq: Four times a day (QID) | ORAL | Status: DC
  Administered 2017-11-25 – 2017-11-27 (×7): 1000 mg via ORAL
  Filled 2017-11-25 (×8): qty 2

## 2017-11-25 MED ORDER — PROPOFOL 10 MG/ML IV BOLUS
INTRAVENOUS | Status: DC | PRN
  Administered 2017-11-25: 170 mg via INTRAVENOUS

## 2017-11-25 MED ORDER — LABETALOL HCL 5 MG/ML IV SOLN
INTRAVENOUS | Status: DC | PRN
  Administered 2017-11-25 (×4): 5 mg via INTRAVENOUS

## 2017-11-25 MED ORDER — HYDROMORPHONE HCL 1 MG/ML IJ SOLN
1.0000 mg | Freq: Once | INTRAMUSCULAR | Status: AC
  Administered 2017-11-25: 1 mg via INTRAVENOUS
  Filled 2017-11-25: qty 1

## 2017-11-25 MED ORDER — SODIUM CHLORIDE 0.9% FLUSH: 9.0000 mL | INTRAVENOUS | Status: DC | PRN

## 2017-11-25 MED ORDER — DIPHENHYDRAMINE HCL 12.5 MG/5ML PO ELIX: 12.5000 mg | ORAL_SOLUTION | Freq: Four times a day (QID) | ORAL | Status: DC | PRN

## 2017-11-25 MED ORDER — DEXTROSE 5 % IV SOLN
2.0000 g | Freq: Two times a day (BID) | INTRAVENOUS | Status: AC
  Administered 2017-11-25 – 2017-11-26 (×3): 2 g via INTRAVENOUS
  Filled 2017-11-25 (×3): qty 2

## 2017-11-25 MED ORDER — PROMETHAZINE HCL 25 MG/ML IJ SOLN
6.2500 mg | Freq: Once | INTRAMUSCULAR | Status: AC
  Administered 2017-11-25: 6.25 mg via INTRAVENOUS

## 2017-11-25 MED ORDER — MIDAZOLAM HCL 2 MG/2ML IJ SOLN
INTRAMUSCULAR | Status: AC
  Filled 2017-11-25: qty 2

## 2017-11-25 MED ORDER — METOPROLOL TARTRATE 5 MG/5ML IV SOLN
5.0000 mg | Freq: Four times a day (QID) | INTRAVENOUS | Status: AC | PRN
  Administered 2017-11-27 – 2017-11-29 (×4): 5 mg via INTRAVENOUS
  Filled 2017-11-25 (×4): qty 5

## 2017-11-25 MED ORDER — LABETALOL HCL 5 MG/ML IV SOLN
INTRAVENOUS | Status: AC
  Filled 2017-11-25: qty 4

## 2017-11-25 MED ORDER — ALVIMOPAN 12 MG PO CAPS
12.0000 mg | ORAL_CAPSULE | ORAL | Status: AC
  Administered 2017-11-25: 12 mg via ORAL
  Filled 2017-11-25: qty 1

## 2017-11-25 MED ORDER — BUPIVACAINE LIPOSOME 1.3 % IJ SUSP
INTRAMUSCULAR | Status: DC | PRN
  Administered 2017-11-25: 20 mL

## 2017-11-25 MED ORDER — ONDANSETRON HCL 4 MG/2ML IJ SOLN
4.0000 mg | Freq: Once | INTRAMUSCULAR | Status: AC
  Administered 2017-11-25: 4 mg via INTRAVENOUS

## 2017-11-25 MED ORDER — ENOXAPARIN SODIUM 40 MG/0.4ML ~~LOC~~ SOLN
40.0000 mg | SUBCUTANEOUS | Status: DC
  Administered 2017-11-26: 40 mg via SUBCUTANEOUS
  Filled 2017-11-25 (×3): qty 0.4

## 2017-11-25 MED ORDER — HYDROMORPHONE HCL 1 MG/ML IJ SOLN
0.2500 mg | INTRAMUSCULAR | Status: DC | PRN
  Administered 2017-11-25: 0.5 mg via INTRAVENOUS
  Filled 2017-11-25: qty 1

## 2017-11-25 MED ORDER — MIDAZOLAM HCL 2 MG/2ML IJ SOLN
1.0000 mg | INTRAMUSCULAR | Status: AC
  Administered 2017-11-25 (×2): 2 mg via INTRAVENOUS
  Filled 2017-11-25: qty 2

## 2017-11-25 MED ORDER — ONDANSETRON HCL 4 MG/2ML IJ SOLN
INTRAMUSCULAR | Status: AC
  Filled 2017-11-25: qty 2

## 2017-11-25 MED ORDER — DIPHENHYDRAMINE HCL 50 MG/ML IJ SOLN: 12.5000 mg | Freq: Four times a day (QID) | INTRAMUSCULAR | Status: DC | PRN

## 2017-11-25 MED ORDER — NALOXONE HCL 0.4 MG/ML IJ SOLN: 0.4000 mg | INTRAMUSCULAR | Status: DC | PRN

## 2017-11-25 MED ORDER — KETOROLAC TROMETHAMINE 30 MG/ML IJ SOLN
30.0000 mg | Freq: Three times a day (TID) | INTRAMUSCULAR | Status: AC
  Administered 2017-11-25 – 2017-11-27 (×6): 30 mg via INTRAVENOUS
  Filled 2017-11-25 (×6): qty 1

## 2017-11-25 MED ORDER — CHLORHEXIDINE GLUCONATE CLOTH 2 % EX PADS: 6.0000 | MEDICATED_PAD | Freq: Once | CUTANEOUS | Status: DC

## 2017-11-25 MED ORDER — ROCURONIUM BROMIDE 50 MG/5ML IV SOLN
INTRAVENOUS | Status: AC
  Filled 2017-11-25: qty 2

## 2017-11-25 MED ORDER — SUGAMMADEX SODIUM 500 MG/5ML IV SOLN
INTRAVENOUS | Status: DC | PRN
  Administered 2017-11-25: 250 mg via INTRAVENOUS

## 2017-11-25 MED ORDER — SURGILUBE EX GEL
CUTANEOUS | Status: DC | PRN
  Administered 2017-11-25: 1 via TOPICAL

## 2017-11-25 MED ORDER — PROPOFOL 10 MG/ML IV BOLUS
INTRAVENOUS | Status: AC
  Filled 2017-11-25: qty 40

## 2017-11-25 MED ORDER — HYDROMORPHONE 1 MG/ML IV SOLN: INTRAVENOUS | Status: DC

## 2017-11-25 MED ORDER — FENTANYL CITRATE (PF) 100 MCG/2ML IJ SOLN
INTRAMUSCULAR | Status: AC
  Filled 2017-11-25: qty 2

## 2017-11-25 MED ORDER — ALVIMOPAN 12 MG PO CAPS
12.0000 mg | ORAL_CAPSULE | Freq: Two times a day (BID) | ORAL | Status: DC
  Administered 2017-11-26 – 2017-11-27 (×3): 12 mg via ORAL
  Filled 2017-11-25 (×4): qty 1

## 2017-11-25 MED ORDER — ROCURONIUM BROMIDE 100 MG/10ML IV SOLN
INTRAVENOUS | Status: DC | PRN
  Administered 2017-11-25: 20 mg via INTRAVENOUS
  Administered 2017-11-25: 10 mg via INTRAVENOUS
  Administered 2017-11-25: 50 mg via INTRAVENOUS
  Administered 2017-11-25 (×2): 10 mg via INTRAVENOUS

## 2017-11-25 MED ORDER — MORPHINE SULFATE (PF) 2 MG/ML IV SOLN
2.0000 mg | INTRAVENOUS | Status: DC | PRN
  Administered 2017-11-25: 2 mg via INTRAVENOUS
  Filled 2017-11-25: qty 1

## 2017-11-25 MED ORDER — ONDANSETRON 4 MG PO TBDP: 4.0000 mg | ORAL_TABLET | Freq: Four times a day (QID) | ORAL | Status: DC | PRN

## 2017-11-25 MED ORDER — ENOXAPARIN SODIUM 40 MG/0.4ML ~~LOC~~ SOLN
40.0000 mg | Freq: Once | SUBCUTANEOUS | Status: AC
  Administered 2017-11-25: 40 mg via SUBCUTANEOUS
  Filled 2017-11-25: qty 0.4

## 2017-11-25 MED ORDER — MIDAZOLAM HCL 5 MG/5ML IJ SOLN
INTRAMUSCULAR | Status: DC | PRN
  Administered 2017-11-25: 2 mg via INTRAVENOUS

## 2017-11-25 MED ORDER — DOCUSATE SODIUM 100 MG PO CAPS
100.0000 mg | ORAL_CAPSULE | Freq: Two times a day (BID) | ORAL | Status: DC
  Administered 2017-11-25 – 2017-11-29 (×8): 100 mg via ORAL
  Filled 2017-11-25 (×8): qty 1

## 2017-11-25 MED ORDER — ONDANSETRON HCL 4 MG/2ML IJ SOLN
4.0000 mg | Freq: Four times a day (QID) | INTRAMUSCULAR | Status: DC | PRN
  Filled 2017-11-25: qty 2

## 2017-11-25 MED ORDER — LIDOCAINE HCL 1 % IJ SOLN
INTRAMUSCULAR | Status: DC | PRN
  Administered 2017-11-25: 30 mg via INTRADERMAL

## 2017-11-25 MED ORDER — SIMETHICONE 80 MG PO CHEW
40.0000 mg | CHEWABLE_TABLET | Freq: Four times a day (QID) | ORAL | Status: DC | PRN
  Administered 2017-11-29: 40 mg via ORAL
  Filled 2017-11-25: qty 1

## 2017-11-25 MED ORDER — CELECOXIB 100 MG PO CAPS
200.0000 mg | ORAL_CAPSULE | ORAL | Status: AC
  Administered 2017-11-25: 200 mg via ORAL
  Filled 2017-11-25: qty 2

## 2017-11-25 MED ORDER — LACTATED RINGERS IV SOLN
INTRAVENOUS | Status: DC
  Administered 2017-11-25 (×4): via INTRAVENOUS

## 2017-11-25 MED ORDER — LACTATED RINGERS IV SOLN
INTRAVENOUS | Status: DC
  Administered 2017-11-25 – 2017-11-28 (×5): via INTRAVENOUS

## 2017-11-25 MED ORDER — SUCCINYLCHOLINE CHLORIDE 20 MG/ML IJ SOLN
INTRAMUSCULAR | Status: AC
  Filled 2017-11-25: qty 1

## 2017-11-25 MED ORDER — 0.9 % SODIUM CHLORIDE (POUR BTL) OPTIME
TOPICAL | Status: DC | PRN
  Administered 2017-11-25: 2000 mL

## 2017-11-25 MED ORDER — SODIUM CHLORIDE 0.9% FLUSH
INTRAVENOUS | Status: AC
  Filled 2017-11-25: qty 10

## 2017-11-25 MED ORDER — BUPIVACAINE LIPOSOME 1.3 % IJ SUSP
INTRAMUSCULAR | Status: AC
  Filled 2017-11-25: qty 20

## 2017-11-25 MED ORDER — CEFOTETAN DISODIUM-DEXTROSE 2-2.08 GM-%(50ML) IV SOLR
2.0000 g | INTRAVENOUS | Status: AC
  Administered 2017-11-25: 2 g via INTRAVENOUS
  Filled 2017-11-25: qty 50

## 2017-11-25 MED ORDER — CELECOXIB 100 MG PO CAPS
200.0000 mg | ORAL_CAPSULE | Freq: Two times a day (BID) | ORAL | Status: DC
  Administered 2017-11-25: 200 mg via ORAL
  Filled 2017-11-25: qty 2

## 2017-11-25 SURGICAL SUPPLY — 43 items
BAG HAMPER (MISCELLANEOUS) ×2 IMPLANT
CHLORAPREP W/TINT 26ML (MISCELLANEOUS) ×1 IMPLANT
CLOTH BEACON ORANGE TIMEOUT ST (SAFETY) ×2 IMPLANT
COVER LIGHT HANDLE STERIS (MISCELLANEOUS) ×4 IMPLANT
DRAPE WARM FLUID 44X44 (DRAPE) ×2 IMPLANT
DRSG OPSITE POSTOP 4X10 (GAUZE/BANDAGES/DRESSINGS) ×1 IMPLANT
ELECT BLADE 6 FLAT ULTRCLN (ELECTRODE) ×1 IMPLANT
ELECT REM PT RETURN 9FT ADLT (ELECTROSURGICAL) ×2
ELECTRODE REM PT RTRN 9FT ADLT (ELECTROSURGICAL) ×1 IMPLANT
GAUZE IODOFORM PACK 1/2 7832 (GAUZE/BANDAGES/DRESSINGS) ×1 IMPLANT
GAUZE SPONGE 4X4 12PLY STRL (GAUZE/BANDAGES/DRESSINGS) ×2 IMPLANT
GAUZE SPONGE 4X4 16PLY XRAY LF (GAUZE/BANDAGES/DRESSINGS) ×1 IMPLANT
GLOVE BIO SURGEON STRL SZ 6.5 (GLOVE) ×3 IMPLANT
GLOVE BIOGEL PI IND STRL 6.5 (GLOVE) ×1 IMPLANT
GLOVE BIOGEL PI IND STRL 7.0 (GLOVE) ×1 IMPLANT
GLOVE BIOGEL PI INDICATOR 6.5 (GLOVE) ×2
GLOVE BIOGEL PI INDICATOR 7.0 (GLOVE) ×6
GLOVE ECLIPSE 8.0 STRL XLNG CF (GLOVE) ×1 IMPLANT
GLOVE SURG SS PI 7.5 STRL IVOR (GLOVE) ×5 IMPLANT
GOWN STRL REUS W/TWL LRG LVL3 (GOWN DISPOSABLE) ×9 IMPLANT
HANDLE SUCTION POOLE (INSTRUMENTS) ×1 IMPLANT
INST SET MAJOR GENERAL (KITS) ×2 IMPLANT
KIT ROOM TURNOVER APOR (KITS) ×2 IMPLANT
LIGASURE IMPACT 36 18CM CVD LR (INSTRUMENTS) ×2 IMPLANT
MANIFOLD NEPTUNE II (INSTRUMENTS) ×2 IMPLANT
NS IRRIG 1000ML POUR BTL (IV SOLUTION) ×3 IMPLANT
PACK ABDOMINAL MAJOR (CUSTOM PROCEDURE TRAY) ×2 IMPLANT
PAD ARMBOARD 7.5X6 YLW CONV (MISCELLANEOUS) ×2 IMPLANT
SET BASIN LINEN APH (SET/KITS/TRAYS/PACK) ×2 IMPLANT
SHEET LAVH (DRAPES) ×1 IMPLANT
SPONGE LAP 18X18 X RAY DECT (DISPOSABLE) ×3 IMPLANT
STAPLER CIRC CVD 29MM 37CM (STAPLE) ×1 IMPLANT
STAPLER PROXIMATE 55 BLUE (STAPLE) ×1 IMPLANT
STAPLER RELOADABLE 65 2-0 SUT (MISCELLANEOUS) IMPLANT
STAPLER SYS INTERNAL RELOAD SS (MISCELLANEOUS) ×2 IMPLANT
STAPLER VISISTAT (STAPLE) ×1 IMPLANT
SUCTION POOLE HANDLE (INSTRUMENTS) ×2
SUT PDS AB CT VIOLET #0 27IN (SUTURE) ×2 IMPLANT
SUT VIC AB 0 CT1 27 (SUTURE) ×2
SUT VIC AB 0 CT1 27XCR 8 STRN (SUTURE) IMPLANT
SYR BULB IRRIGATION 50ML (SYRINGE) ×1 IMPLANT
TAPE CLOTH SURG 4X10 WHT LF (GAUZE/BANDAGES/DRESSINGS) ×1 IMPLANT
TRAY FOLEY CATH SILVER 16FR (SET/KITS/TRAYS/PACK) ×2 IMPLANT

## 2017-11-25 NOTE — Transfer of Care (Signed)
Immediate Anesthesia Transfer of Care Note  Patient: Steven Mcbride  Procedure(s) Performed: END COLOSTOMY TAKEDOWN (N/A )  Patient Location: PACU  Anesthesia Type:General  Level of Consciousness: awake and patient cooperative  Airway & Oxygen Therapy: Patient Spontanous Breathing and Patient connected to face mask oxygen  Post-op Assessment: Report given to RN, Post -op Vital signs reviewed and stable and Patient moving all extremities  Post vital signs: Reviewed and stable  Last Vitals:  Vitals:   11/25/17 0657  Temp: 36.8 C    Last Pain:  Vitals:   11/25/17 0657  TempSrc: Oral      Patients Stated Pain Goal: 7 (11/25/17 0631)  Complications: No apparent anesthesia complications

## 2017-11-25 NOTE — Anesthesia Postprocedure Evaluation (Signed)
Anesthesia Post Note  Patient: Steven Mcbride  Procedure(s) Performed: COLOSTOMY REVERSAL (N/A )  Patient location during evaluation: PACU Anesthesia Type: General Level of consciousness: awake and patient cooperative Pain management: pain level controlled Vital Signs Assessment: post-procedure vital signs reviewed and stable Respiratory status: spontaneous breathing, nonlabored ventilation and respiratory function stable Cardiovascular status: blood pressure returned to baseline Anesthetic complications: yes Anesthetic complication details: PONVComments: Pt nauseated but controlled with Phenergan.     Last Vitals:  Vitals:   11/25/17 0657  Temp: 36.8 C    Last Pain:  Vitals:   11/25/17 0657  TempSrc: Oral                 Winn Muehl J

## 2017-11-25 NOTE — Anesthesia Preprocedure Evaluation (Signed)
Anesthesia Evaluation  Patient identified by MRN, date of birth, ID band Patient awake    Reviewed: Allergy & Precautions, NPO status , Patient's Chart, lab work & pertinent test results  Airway Mallampati: I  TM Distance: >3 FB Neck ROM: Full   Comment: Thick neck Dental no notable dental hx. (+) Teeth Intact   Pulmonary Current Smoker,    Pulmonary exam normal        Cardiovascular negative cardio ROS   Rhythm:Regular Rate:Normal     Neuro/Psych negative neurological ROS  negative psych ROS   GI/Hepatic S/p colostomy for diverticulitis    Endo/Other    Renal/GU      Musculoskeletal   Abdominal   Peds  Hematology   Anesthesia Other Findings Gout   Reproductive/Obstetrics                             Anesthesia Physical Anesthesia Plan  ASA: II  Anesthesia Plan: General   Post-op Pain Management:    Induction: Intravenous  PONV Risk Score and Plan:   Airway Management Planned: Oral ETT  Additional Equipment:   Intra-op Plan:   Post-operative Plan: Extubation in OR  Informed Consent: I have reviewed the patients History and Physical, chart, labs and discussed the procedure including the risks, benefits and alternatives for the proposed anesthesia with the patient or authorized representative who has indicated his/her understanding and acceptance.     Plan Discussed with:   Anesthesia Plan Comments:         Anesthesia Quick Evaluation

## 2017-11-25 NOTE — Progress Notes (Signed)
Pt complaining of nausea. MD notified and received verbal order to administer Zofran 4 mg via IV.

## 2017-11-25 NOTE — Op Note (Signed)
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 Henreitta Leber, MD   Assistants: Franky Macho, MD    Anesthesia: General endotracheal   Anesthesiologist: Dr. Jayme Cloud, MD    Specimens: Donuts X2 (proximal donut intact but torn while removing from stapler; distal donut intact) and descending colon   Estimated Blood Loss: 75cc   Blood Replacement: None    Complications: None  Wound Class: Dirty    Operative Indications: Steven Mcbride is a 47 yo otherwise healthy patient who underwent a Hartman's procedure 09/17/2017 for a perforated diverticulitis with colonic stricture after undergoing a colonoscopy the day prior to with poor prep, and presenting to the ED with CT scan findings of a small foci of pneumoperitoneum. He did well post operatively, and has healed well. His colonoscopy did identify the cecum and no gross lesions were appreciated.  He underwent a barium enema with a patent rectal stump that tapered somewhat, and a patent colon with stool in the colon. We discussed the risk and benefits of closure of his ostomy including but not limited to bleeding, infection, risk of leak, risk of needing an ileostomy, and he opted to proceed.   Prior to surgery, the patient did not have any power, and he did not get to complete his preoperative shower. He did perform the CHG wipes on arrival, and did complete his bowel prep and antibiotic prep as well as the enema.    Findings: Adhesions with small bowel in the pelvis and along the anterior abdominal wall around the ostomy, rectal stump at the level of the pelvic brim    Procedure: The patient was taken to the operating room and general endotracheal anesthesia was induced. He was placed in the lithotomy position with all pressure points padded.  The ostomy site was closed with  a running silk suture. Intravenous antibiotics were administered per protocol.  An orogastric tube positioned to decompress the stomach. The abdomen and peritoneum was prepared and draped in the usual sterile fashion.   The midline incision was opened and carried down to the fascia with electrocautery. The prior suture was cut and removed, and the peritoneum was entered. There were some thin adhesion to the anterior abdominal wall and small bowel, and this was dissected with sharp dissection. Adhesions were dissected until the end colostomy was free circumferentially intra-abdominally.  From there attention was turned to the end colostomy site at the skin. A towel was placed over the incision. The silk suture was cut and removed, and a circumferentially excision of the colostomy and skin edge was performed down through the subcutaneous tissue and down to the fascia. Care was taken to not injure the colon.  Once the fascia was identified. The colostomy was freed up from the fascial edge circumferentially. A linear cutting stapler was then used to close the specimen, resecting approximately 2cm of descending colon. The colon was placed intra-abdominally.    Attention was then turned to the pelvis. Adhesions were dissected, freeing the small bowel from the pelvic brim, and we were able to identify the marking suture on the rectal stump.  The stump was at the pelvic brim.  The cecum was redundant and the appendix was lying over the top of the stump. This was carefully dissected off with sharp and blunt dissection, and the cecum was packed into the right quadrant, and the small bowel  was packed into the right upper quadrant.  The rectal stump was carefully dissected on both edges, and the left ureter was identified traversing just lateral to the freed section of mesentery.  There were no obvious diverticula, and the colon otherwise appeared healthy.  An EEA 29mm sizer was used, and was placed into the rectal stump  without difficulty.    The white line of Toldt was dissected and the splenic flexure was taken down with sharp and blunt dissection, freeing up the descending colon sufficiently where the proximal end laid without tension in the pelvis.  There was no obvious diverticular disease in this portion of the descending colon.    The edge of the descending colon staple line was grasped with Allis clamps. The automatic pursestringer was used, and towels were laid out.  The 29 mm anvil was placed. The pursestring was tightened around the anvil and tied.    At this time, Dr. Lovell SheehanJenkins went below and placed the 29 mm EEA stapler, and I carefully helped to guide it into position.  The stapler was maneuvered to the end of the rectal stump and was opened. The spike was deployed anteriorly just above the prior staple line.  The anvil was placed onto the spike, and the stapler was tightened down. The colon was in proper alignment and there was no tissue between the colon edges. The stapler was fired.  Once the stapler and the anvil were removed, the donuts were inspected and noted to be both intact on the stapler. The proximal donut was torn when removing it from the stapler.    A leak test was performed, and there was a tiny pinpoint leak identified on the right anterior portion of the stapler line. This was oversewn with multiple silk Lembert sutures.  The leak test was performed again, and no leak was noted. The omentum was not long enough to reach the pelvis, but there was significant epiploic fat that was used to over the anterior portion of the staple line and the oversewn area. This was tacked in place with a silk suture.   The mesenteric defect had no herniated bowel, and the abdominal cavity was copiously irrigated. Final inspection revealed acceptable hemostasis. All counts were correct.  Everyone re-gowned and gloved, and the ostomy site was closed with interrupted 0 Vicryl suture to the anterior fascia, and the  peritoneum and posterior fascia were closed with a running 0 Vicryl suture.  The midline was closed in the standard fashion with two running 0 PDS sutures.  The skin of the ostomy site was loosely closed with staples, and iodoform wick was placed into the site between the staple line. The midline incision was irrigated and closed with staples.  The patient was awakened from anesthesia and extubate without complication.  The patient went to the PACU in stable condition.  The final count was correct.    Steven GreenhouseLindsay Bridges, MD [facility] [address] [place], [place] [id number] [phone] (office)

## 2017-11-25 NOTE — Progress Notes (Signed)
Respiratory Care Note: Attempted to Instruct the patient on the use of IS.  The patient refused to do it at this time due being in pain. The family member stated that he has used one before. I set the patient's goal and verbally went over the way to use for a refresher with the patient and family member. RN aware.

## 2017-11-25 NOTE — Interval H&P Note (Signed)
History and Physical Interval Note:  11/25/2017 7:19 AM  Steven Mcbride  has presented today for surgery, with the diagnosis of colostomy in place  The various methods of treatment have been discussed with the patient and family. After consideration of risks, benefits and other options for treatment, the patient has consented to  Procedure(s): COLOSTOMY TAKEDOWN (N/A) COLOSTOMY CLOSURE (N/A) as a surgical intervention .  The patient's history has been reviewed, patient examined, no change in status, stable for surgery.  I have reviewed the patient's chart and labs.  Questions were answered to the patient's satisfaction.    Discussed surgery, possibility of ileostomy, no further questions.  Lucretia RoersLindsay C Skylah Delauter

## 2017-11-25 NOTE — Progress Notes (Signed)
[facility]  Patient with abdominal pain. Giving 1mg  dilaudid now. Morphine not helping. Abdomen mildly distended, soft, ostomy site with some staining, no rebound or guarding.   Will get pain under control with dilaudid and PCA now for overnight. Toradol also ordered scheduled.  Will monitor.  Algis GreenhouseLindsay Bridges, MD [facility] [address] [place], [place] [address] [phone] (office)

## 2017-11-25 NOTE — Anesthesia Procedure Notes (Signed)
Procedure Name: Intubation Date/Time: 11/25/2017 7:40 AM Performed by: Charmaine Downs, CRNA Pre-anesthesia Checklist: Patient identified, Patient being monitored, Timeout performed, Emergency Drugs available and Suction available Patient Re-evaluated:Patient Re-evaluated prior to induction Oxygen Delivery Method: Circle System Utilized Preoxygenation: Pre-oxygenation with 100% oxygen Induction Type: IV induction Ventilation: Mask ventilation without difficulty and Oral airway inserted - appropriate to patient size Laryngoscope Size: Mac and 4 Grade View: Grade II Tube type: Oral Tube size: 8.0 mm Number of attempts: 1 Airway Equipment and Method: stylet Placement Confirmation: ETT inserted through vocal cords under direct vision,  positive ETCO2 and breath sounds checked- equal and bilateral Secured at: 22 cm Tube secured with: Tape Dental Injury: Teeth and Oropharynx as per pre-operative assessment

## 2017-11-26 ENCOUNTER — Encounter (HOSPITAL_COMMUNITY): Payer: Self-pay | Admitting: General Surgery

## 2017-11-26 LAB — BASIC METABOLIC PANEL
ANION GAP: 10 (ref 5–15)
BUN: 13 mg/dL (ref 6–20)
CHLORIDE: 100 mmol/L — AB (ref 101–111)
CO2: 25 mmol/L (ref 22–32)
Calcium: 8.3 mg/dL — ABNORMAL LOW (ref 8.9–10.3)
Creatinine, Ser: 1.02 mg/dL (ref 0.61–1.24)
GFR calc Af Amer: >60 mL/min (ref >60)
GLUCOSE: 104 mg/dL — AB (ref 65–99)
POTASSIUM: 4.4 mmol/L (ref 3.5–5.1)
SODIUM: 135 mmol/L (ref 135–145)

## 2017-11-26 LAB — CBC
HCT: 45.4 % (ref 39.0–52.0)
Hemoglobin: 14.5 g/dL (ref 13.0–17.0)
MCH: 29.4 pg (ref 26.0–34.0)
MCHC: 31.9 g/dL (ref 30.0–36.0)
MCV: 92.1 fL (ref 78.0–100.0)
PLATELETS: 276 10*3/uL (ref 150–400)
RBC: 4.93 MIL/uL (ref 4.22–5.81)
RDW: 14.1 % (ref 11.5–15.5)
WBC: 15.5 10*3/uL — ABNORMAL HIGH (ref 4.0–10.5)

## 2017-11-26 LAB — MAGNESIUM: MAGNESIUM: 1.6 mg/dL — AB (ref 1.7–2.4)

## 2017-11-26 LAB — PHOSPHORUS: Phosphorus: 4.4 mg/dL (ref 2.5–4.6)

## 2017-11-26 MED ORDER — HYDROMORPHONE HCL 1 MG/ML IJ SOLN
0.5000 mg | INTRAMUSCULAR | Status: DC | PRN
  Administered 2017-11-26 – 2017-11-29 (×20): 0.5 mg via INTRAVENOUS
  Filled 2017-11-26 (×20): qty 1

## 2017-11-26 MED ORDER — MAGNESIUM SULFATE 2 GM/50ML IV SOLN
2.0000 g | Freq: Once | INTRAVENOUS | Status: AC
  Administered 2017-11-26: 2 g via INTRAVENOUS
  Filled 2017-11-26: qty 50

## 2017-11-26 MED ORDER — LACTATED RINGERS IV BOLUS (SEPSIS)
500.0000 mL | Freq: Once | INTRAVENOUS | Status: AC
  Administered 2017-11-26: 500 mL via INTRAVENOUS

## 2017-11-26 MED ORDER — LACTATED RINGERS IV BOLUS (SEPSIS): 500.0000 mL | Freq: Once | INTRAVENOUS | Status: DC

## 2017-11-26 NOTE — Anesthesia Postprocedure Evaluation (Signed)
Anesthesia Post Note  Patient: Steven ButtsMichael B Mcbride  Procedure(s) Performed: COLOSTOMY REVERSAL (N/A )  Patient location during evaluation: Nursing Unit Anesthesia Type: General Level of consciousness: awake and alert and patient cooperative Pain management: pain level controlled Vital Signs Assessment: post-procedure vital signs reviewed and stable Respiratory status: spontaneous breathing, nonlabored ventilation and respiratory function stable Cardiovascular status: blood pressure returned to baseline Postop Assessment: no apparent nausea or vomiting Anesthetic complications: no     Last Vitals:  Vitals:   11/26/17 1229 11/26/17 1455  BP:  (!) 146/83  Pulse:  95  Resp: 17 16  Temp:  37.1 C  SpO2: 93% 96%    Last Pain:  Vitals:   11/26/17 1328  TempSrc:   PainSc: Asleep                 Zeffie Bickert J

## 2017-11-26 NOTE — Addendum Note (Signed)
Addendum  created 11/26/17 1642 by Despina HiddenIdacavage, Argenis Kumari J, CRNA   Sign clinical note

## 2017-11-27 LAB — CBC
HEMATOCRIT: 38.2 % — AB (ref 39.0–52.0)
Hemoglobin: 12.4 g/dL — ABNORMAL LOW (ref 13.0–17.0)
MCH: 29.6 pg (ref 26.0–34.0)
MCHC: 32.5 g/dL (ref 30.0–36.0)
MCV: 91.2 fL (ref 78.0–100.0)
Platelets: 241 10*3/uL (ref 150–400)
RBC: 4.19 MIL/uL — ABNORMAL LOW (ref 4.22–5.81)
RDW: 13.9 % (ref 11.5–15.5)
WBC: 14.8 10*3/uL — ABNORMAL HIGH (ref 4.0–10.5)

## 2017-11-27 LAB — BASIC METABOLIC PANEL
Anion gap: 10 (ref 5–15)
BUN: 8 mg/dL (ref 6–20)
CALCIUM: 8.4 mg/dL — AB (ref 8.9–10.3)
CO2: 25 mmol/L (ref 22–32)
CREATININE: 0.75 mg/dL (ref 0.61–1.24)
Chloride: 100 mmol/L — ABNORMAL LOW (ref 101–111)
GFR calc non Af Amer: >60 mL/min (ref >60)
Glucose, Bld: 76 mg/dL (ref 65–99)
Potassium: 4.2 mmol/L (ref 3.5–5.1)
Sodium: 135 mmol/L (ref 135–145)

## 2017-11-27 LAB — MAGNESIUM: Magnesium: 1.8 mg/dL (ref 1.7–2.4)

## 2017-11-27 LAB — PHOSPHORUS: PHOSPHORUS: 2.4 mg/dL — AB (ref 2.5–4.6)

## 2017-11-27 MED ORDER — MAGNESIUM SULFATE 2 GM/50ML IV SOLN
2.0000 g | Freq: Once | INTRAVENOUS | Status: AC
  Administered 2017-11-27: 2 g via INTRAVENOUS
  Filled 2017-11-27: qty 50

## 2017-11-27 MED ORDER — K PHOS MONO-SOD PHOS DI & MONO 155-852-130 MG PO TABS
250.0000 mg | ORAL_TABLET | Freq: Three times a day (TID) | ORAL | Status: AC
  Filled 2017-11-27 (×2): qty 1

## 2017-11-27 MED ORDER — OXYCODONE HCL 5 MG PO TABS
5.0000 mg | ORAL_TABLET | ORAL | Status: DC | PRN
  Administered 2017-11-27 – 2017-11-28 (×6): 5 mg via ORAL
  Filled 2017-11-27 (×6): qty 1

## 2017-11-27 MED ORDER — POTASSIUM & SODIUM PHOSPHATES 280-160-250 MG PO PACK: 1.0000 | PACK | Freq: Three times a day (TID) | ORAL | Status: DC

## 2017-11-27 NOTE — Progress Notes (Addendum)
Late Entry 11/26/2017   [facility] Progress Note  2 Days Post-Op  Subjective: No major complaints. Seen in the AM and decided to keep PCA, saw at lunch, and felt better. PCA discontinued. Says he feels less nauseated. No BM or flatus. Has not really gotten out of bed. Foley left in this AM due to boarderline urine output overnight and looking concentrated.   Objective: Vital signs in last 24 hours: Temp:  [97.7 F (36.5 C)-98.8 F (37.1 C)] 97.7 F (36.5 C) (01/16 45400638) Pulse Rate:  [61-102] 61 (01/16 0638) Resp:  [15-18] 18 (01/16 0638) BP: (146-167)/(83-92) 167/91 (01/16 0638) SpO2:  [93 %-98 %] 98 % (01/16 98110638) Last BM Date: 11/24/17  Intake/Output from previous day: 01/15 0701 - 01/16 0700 In: 728.8 [I.V.:678.8; IV Piggyback:50] Out: 850 [Urine:850] Intake/Output this shift: No intake/output data recorded.  General appearance: alert, cooperative and no distress Resp: normal work breathing GI: soft, mildly distended, appropriately tender, midline with dressing and ostomy with dressing and minimal staining, no rebound or guarding Extremities: extremities normal, atraumatic, no cyanosis or edema  Lab Results:  Recent Labs    11/26/17 0430 11/27/17 0529  WBC 15.5* 14.8*  HGB 14.5 12.4*  HCT 45.4 38.2*  PLT 276 241   BMET Recent Labs    11/26/17 0430 11/27/17 0529  NA 135 135  K 4.4 4.2  CL 100* 100*  CO2 25 25  GLUCOSE 104* 76  BUN 13 8  CREATININE 1.02 0.75  CALCIUM 8.3* 8.4*   Anti-infectives: Anti-infectives (From admission, onward)   Start     Dose/Rate Route Frequency Ordered Stop   11/25/17 2000  cefoTEtan (CEFOTAN) 2 g in dextrose 5 % 50 mL IVPB     2 g 100 mL/hr over 30 Minutes Intravenous Every 12 hours 11/25/17 1305 11/26/17 2015   11/25/17 0622  cefoTEtan in Dextrose 5% (CEFOTAN) IVPB 2 g     2 g Intravenous On call to O.R. 11/25/17 0622 11/25/17 0725      Assessment/Plan: Mr. [name] is POD 1 s/p colostomy reversal.  He is doing fair. He does have some degree of an ileus, and had some lower UOP overnight. Bolused in the AM with improvement. PCA changed to PRN  IS, OOB HD ok, but have some boarderline hypertension, will monitor once pain is more under control NPO except for ice and chips, monitor for ileus, dressing in place Cefotetan surgical prophylaxis immediate perioperative  Daily labs, LR @ 75, 500cc bolus this AM, Mg replaced Keep foley until POD 2 given the boarderline UOP  SCDs, lovenox  Lucretia RoersLindsay C Bridges 11/27/2017

## 2017-11-27 NOTE — Progress Notes (Signed)
[facility] Progress Note  2 Days Post-Op  Subjective: No major complaints but feels bloated and having hiccups. Wants to walk around. Says pain is improved. Overall says things are improving. No BM or flatus.   Objective: Vital signs in last 24 hours: Temp:  [97.7 F (36.5 C)-98.8 F (37.1 C)] 97.7 F (36.5 C) (01/16 16100638) Pulse Rate:  [61-102] 61 (01/16 0638) Resp:  [15-18] 18 (01/16 0638) BP: (146-167)/(83-92) 167/91 (01/16 0638) SpO2:  [93 %-98 %] 98 % (01/16 96040638) Last BM Date: 11/24/17  Intake/Output from previous day: 01/15 0701 - 01/16 0700 In: 728.8 [I.V.:678.8; IV Piggyback:50] Out: 850 [Urine:850] Intake/Output this shift: No intake/output data recorded.  General appearance: alert, cooperative and no distress Resp: normal work of breathing GI: soft, mildly distended, appropriately tender, midline with staples, no erythema or drainage, ostomy site with staples, and some serosangious drainage Extremities: extremities normal, atraumatic, no cyanosis or edema  Lab Results:  Recent Labs    11/26/17 0430 11/27/17 0529  WBC 15.5* 14.8*  HGB 14.5 12.4*  HCT 45.4 38.2*  PLT 276 241   BMET Recent Labs    11/26/17 0430 11/27/17 0529  NA 135 135  K 4.4 4.2  CL 100* 100*  CO2 25 25  GLUCOSE 104* 76  BUN 13 8  CREATININE 1.02 0.75  CALCIUM 8.3* 8.4*    Anti-infectives: Anti-infectives (From admission, onward)   Start     Dose/Rate Route Frequency Ordered Stop   11/25/17 2000  cefoTEtan (CEFOTAN) 2 g in dextrose 5 % 50 mL IVPB     2 g 100 mL/hr over 30 Minutes Intravenous Every 12 hours 11/25/17 1305 11/26/17 2015   11/25/17 0622  cefoTEtan in Dextrose 5% (CEFOTAN) IVPB 2 g     2 g Intravenous On call to O.R. 11/25/17 0622 11/25/17 0725      Assessment/Plan: [name] is POD 2 s/p colostomy reversal overall doing well. Still with some degree of ileus.  PRNs, added back roxicodone  IS, OOB HD ok, monitoring hypertension, has not  received any PRNs for HTN  NPO except for ice and chips Foley out today, continue LR @ 75, Mg and Phos replaced  Daily labs, Leukocytosis trending down  SCDs, lovenox Ambulate today    LOS: 2 days   Lucretia RoersLindsay C Attila Mccarthy 11/27/2017

## 2017-11-27 NOTE — Progress Notes (Signed)
[facility]  Had BM, started clears.  Steven GreenhouseLindsay Hernando Reali, MD [facility] [address] [place], [place] [address] [phone] (office)

## 2017-11-27 NOTE — Progress Notes (Signed)
Pt Steven Mcbride catheter removed per MD order around 0745 this morning. Pt been ambulating around room, and stated he had a small liquidy BM. Will continue to monitor.

## 2017-11-28 LAB — CBC WITH DIFFERENTIAL/PLATELET
BASOS ABS: 0 10*3/uL (ref 0.0–0.1)
BASOS PCT: 0 %
EOS ABS: 0.1 10*3/uL (ref 0.0–0.7)
Eosinophils Relative: 1 %
HCT: 36.4 % — ABNORMAL LOW (ref 39.0–52.0)
Hemoglobin: 11.8 g/dL — ABNORMAL LOW (ref 13.0–17.0)
Lymphocytes Relative: 10 %
Lymphs Abs: 1.2 10*3/uL (ref 0.7–4.0)
MCH: 29.1 pg (ref 26.0–34.0)
MCHC: 32.4 g/dL (ref 30.0–36.0)
MCV: 89.9 fL (ref 78.0–100.0)
MONO ABS: 1.2 10*3/uL — AB (ref 0.1–1.0)
Monocytes Relative: 10 %
NEUTROS ABS: 8.9 10*3/uL — AB (ref 1.7–7.7)
Neutrophils Relative %: 79 %
PLATELETS: 253 10*3/uL (ref 150–400)
RBC: 4.05 MIL/uL — ABNORMAL LOW (ref 4.22–5.81)
RDW: 13.8 % (ref 11.5–15.5)
WBC: 11.4 10*3/uL — ABNORMAL HIGH (ref 4.0–10.5)

## 2017-11-28 LAB — MAGNESIUM: MAGNESIUM: 1.6 mg/dL — AB (ref 1.7–2.4)

## 2017-11-28 LAB — BASIC METABOLIC PANEL
ANION GAP: 9 (ref 5–15)
BUN: <5 mg/dL — ABNORMAL LOW (ref 6–20)
CALCIUM: 8.4 mg/dL — AB (ref 8.9–10.3)
CO2: 25 mmol/L (ref 22–32)
Chloride: 100 mmol/L — ABNORMAL LOW (ref 101–111)
Creatinine, Ser: 0.64 mg/dL (ref 0.61–1.24)
Glucose, Bld: 95 mg/dL (ref 65–99)
Potassium: 3.4 mmol/L — ABNORMAL LOW (ref 3.5–5.1)
SODIUM: 134 mmol/L — AB (ref 135–145)

## 2017-11-28 LAB — PHOSPHORUS: PHOSPHORUS: 2.1 mg/dL — AB (ref 2.5–4.6)

## 2017-11-28 MED ORDER — MAGNESIUM SULFATE 4 GM/100ML IV SOLN
4.0000 g | Freq: Once | INTRAVENOUS | Status: AC
  Administered 2017-11-28: 4 g via INTRAVENOUS
  Filled 2017-11-28: qty 100

## 2017-11-28 MED ORDER — IBUPROFEN 600 MG PO TABS: 600.0000 mg | ORAL_TABLET | Freq: Four times a day (QID) | ORAL | Status: DC | PRN

## 2017-11-28 MED ORDER — LISINOPRIL 10 MG PO TABS
10.0000 mg | ORAL_TABLET | Freq: Every day | ORAL | Status: DC
  Administered 2017-11-28 – 2017-11-29 (×2): 10 mg via ORAL
  Filled 2017-11-28 (×2): qty 1

## 2017-11-28 MED ORDER — POTASSIUM & SODIUM PHOSPHATES 280-160-250 MG PO PACK
2.0000 | PACK | Freq: Three times a day (TID) | ORAL | Status: AC
  Administered 2017-11-28: 2 via ORAL
  Filled 2017-11-28 (×3): qty 2

## 2017-11-28 NOTE — Progress Notes (Signed)
[facility] Progress Note  3 Days Post-Op  Subjective: No major issues. Feeling good. Says having more BMs than recorded. Still a little bloated.   Objective: Vital signs in last 24 hours: Temp:  [97.9 F (36.6 C)-98.4 F (36.9 C)] 98.4 F (36.9 C) (01/17 0400) Pulse Rate:  [89-99] 99 (01/17 0400) Resp:  [18] 18 (01/17 0400) BP: (161-185)/(95-100) 181/100 (01/17 0400) SpO2:  [94 %-99 %] 95 % (01/17 0400) Weight:  [194 lb 14.2 oz (88.4 kg)] 194 lb 14.2 oz (88.4 kg) (01/16 2341) Last BM Date: 11/27/17  Intake/Output from previous day: 01/16 0701 - 01/17 0700 In: 1971.3 [P.O.:240; I.V.:1731.3] Out: -  Intake/Output this shift: No intake/output data recorded.  General appearance: alert, cooperative and no distress Resp: normal work breathing GI: soft, appropriately tender, midline with staples, no erythema or drainge, ostomy site with loose staples, minimal drainage, no erythema  Extremities: extremities normal, atraumatic, no cyanosis or edema  Lab Results:  Recent Labs    11/27/17 0529 11/28/17 0601  WBC 14.8* 11.4*  HGB 12.4* 11.8*  HCT 38.2* 36.4*  PLT 241 253   BMET Recent Labs    11/27/17 0529 11/28/17 0601  NA 135 134*  K 4.2 3.4*  CL 100* 100*  CO2 25 25  GLUCOSE 76 95  BUN 8 <5*  CREATININE 0.75 0.64  CALCIUM 8.4* 8.4*    Assessment/Plan: [name] is POD 3 s/p colostomy reversal. He is doing fair. Tolerating liquids. PRNs for pain IS, OOB HTN from 140/80 to 180/100, starting lisinpril 10mg  daily now, will need to see PCP soon post op to adjust needs  Soft diet  CBC discontinued, leukocytosis trending down, UOP good, LR stopped, Phos, K and Mg replaced SCDs, lovenox Hopefully home tomorrow    LOS: 3 days    Steven Mcbride 11/28/2017

## 2017-11-28 NOTE — Progress Notes (Signed)
[facility]  Doing well. BP still up at 1300, will see how trends down after lisinopril, Likely home tomorrow.  Algis GreenhouseLindsay Khadijah Mastrianni, MD [facility] [address] [place], [place] [address] [phone] (office)

## 2017-11-29 LAB — BASIC METABOLIC PANEL
Anion gap: 10 (ref 5–15)
BUN: 6 mg/dL (ref 6–20)
CALCIUM: 8.7 mg/dL — AB (ref 8.9–10.3)
CHLORIDE: 104 mmol/L (ref 101–111)
CO2: 24 mmol/L (ref 22–32)
CREATININE: 0.64 mg/dL (ref 0.61–1.24)
GFR calc non Af Amer: >60 mL/min (ref >60)
GLUCOSE: 98 mg/dL (ref 65–99)
Potassium: 3.5 mmol/L (ref 3.5–5.1)
Sodium: 138 mmol/L (ref 135–145)

## 2017-11-29 LAB — MAGNESIUM: MAGNESIUM: 1.9 mg/dL (ref 1.7–2.4)

## 2017-11-29 LAB — PHOSPHORUS: Phosphorus: 3.1 mg/dL (ref 2.5–4.6)

## 2017-11-29 MED ORDER — DOCUSATE SODIUM 100 MG PO CAPS: 100.0000 mg | ORAL_CAPSULE | Freq: Two times a day (BID) | ORAL | 0 refills | Status: DC | PRN

## 2017-11-29 MED ORDER — LISINOPRIL 10 MG PO TABS: 10.0000 mg | ORAL_TABLET | Freq: Every day | ORAL | 0 refills | Status: DC

## 2017-11-29 MED ORDER — OXYCODONE HCL 5 MG PO TABS: 5.0000 mg | ORAL_TABLET | ORAL | 0 refills | Status: DC | PRN

## 2017-11-29 MED ORDER — ONDANSETRON 4 MG PO TBDP: 4.0000 mg | ORAL_TABLET | Freq: Four times a day (QID) | ORAL | 0 refills | Status: DC | PRN

## 2017-11-29 NOTE — Progress Notes (Signed)
Pt's IV catheter removed and intact. Pt's IV site clean dry and intact. Discharge instructions including medications and follow up appointments were reviewed and discussed with patient. All questions were answered and no further questions at this time. Pt in stable condition and in no acute distress at time of discharge. Pt escorted by RN. 

## 2017-11-29 NOTE — Plan of Care (Signed)
Pt has completed all goals

## 2017-11-29 NOTE — Discharge Summary (Signed)
Physician Discharge Summary  Patient ID: Steven Mcbride MRN: [id number] DOB/AGE: 1971-03-31 47 y.o.  Admit date: 11/25/2017 Discharge date: 11/29/2017  Admission Diagnoses: Colostomy in Place   Discharge Diagnoses:  Active Problems:   Colostomy in place [facility](HCC)   Diverticulosis of colon without hemorrhage   Discharged Condition: good  Hospital Course: [name] was admitted to the hospital after end colostomy reversal on 11/25/2017.  He did well post operatively, and was slowly progressed to clear diet and then a soft diet. He was noted to have hypertension preoperatively and continued to have this post operatively, requiring some PRNs for BP control. His BP ranged from 140/80 to 180/100, and given the elevation and the persistent nature despite adequate pain control, I started lisinopril daily.  He has been on BP med in the past but has been off of this medication.  Prior to his discharge, he was tolerating a diet, he was urinating, and ambulating. He has had some bloating, but is passing gas and having some liquid BMs. He feels he can stay hydrated and wants to go home.   Consults: None  Significant Diagnostic Studies: None  Treatments: Surgery: End colostomy reversal   Discharge Exam: Blood pressure (!) 178/104, pulse 90, temperature 97.7 F (36.5 C), temperature source Oral, resp. rate 16, height 5\' 11"  (1.803 m), weight 194 lb 14.2 oz (88.4 kg), SpO2 96 %. General appearance: alert, cooperative and no distress Resp: normal work breathing GI: soft appropriately tender, midline c/d/i with staples, ostomy site with dressing, no rebound or guarding Extremities: extremities normal, atraumatic, no cyanosis or edema  Disposition: 06-Home-Health Care Svc  Discharge Instructions    Call MD for:  difficulty breathing, headache or visual disturbances   Complete by:  As directed    Call MD for:  extreme fatigue   Complete by:  As directed    Call MD for:  persistant dizziness or  light-headedness   Complete by:  As directed    Call MD for:  persistant nausea and vomiting   Complete by:  As directed    Call MD for:  redness, tenderness, or signs of infection (pain, swelling, redness, odor or green/yellow discharge around incision site)   Complete by:  As directed    Call MD for:  severe uncontrolled pain   Complete by:  As directed    Call MD for:  temperature >100.4   Complete by:  As directed    Diet - low sodium heart healthy   Complete by:  As directed    Increase activity slowly   Complete by:  As directed      Allergies as of 11/29/2017   No Known Allergies     Medication List    STOP taking these medications   metroNIDAZOLE 500 MG tablet Commonly known as:  FLAGYL   neomycin 500 MG tablet Commonly known as:  MYCIFRADIN     TAKE these medications   acetaminophen 325 MG tablet Commonly known as:  TYLENOL Take 650 mg by mouth every 6 (six) hours as needed for mild pain.   colchicine 0.6 MG tablet TAKE 2 TABLETS AT ONSET, THEN TAKE 1 TABLET 1 HOUR LATER. TAKE 1 TABLET TWICE DAILY FOR 2 MORE DAYS AS NEEDED FOR GOUT FLARES   docusate sodium 100 MG capsule Commonly known as:  COLACE Take 1 capsule (100 mg total) by mouth 2 (two) times daily as needed for mild constipation. What changed:    when to take this  reasons to  take this   indomethacin 50 MG capsule Commonly known as:  INDOCIN take 50 mg by mouth three times a day if needed gout flares   lisinopril 10 MG tablet Commonly known as:  PRINIVIL,ZESTRIL Take 1 tablet (10 mg total) by mouth daily. Start taking on:  11/30/2017   ondansetron 4 MG disintegrating tablet Commonly known as:  ZOFRAN-ODT Take 1 tablet (4 mg total) by mouth every 6 (six) hours as needed for nausea.   oxyCODONE 5 MG immediate release tablet Commonly known as:  Oxy IR/ROXICODONE Take 1-2 tablets (5-10 mg total) by mouth every 4 (four) hours as needed for severe pain or breakthrough pain. What changed:  how  much to take      Follow-up Information    Lucretia Roers, MD Follow up in 2 week(s).   Specialty:  General Surgery Contact information: [address] [place] [address] [phone]        Avis Epley, PA-C Follow up.   Specialty:  Family Medicine Why:  [place] information: [address] [place] [address] [phone]          Discharge Instructions: Shower per your regular routine. Do not submerge in a bath for over 6 weeks. Take tylenol and ibuprofen as needed for pain control, alternating every 4-6 hours.  Take Roxicodone for breakthrough pain. Take colace for constipation related to narcotic pain medication. No lifting > 10 lbs for 6-8 weeks. Do not pick at the staples on your incision sites.  Cover the ostomy site with a dressing as needed daily. Please monitor your blood pressure; you can get automatic blood pressure monitors at the drugstore, or you can get your blood pressure taken at the drugstore.  You have been started on lisinopril for your high blood pressure. You are still having higher blood pressure than you should be having. Please follow up with your PCP as soon as possible.  Your goal BP is Less than 120/ 80.   Signed: Lucretia Roers 11/29/2017, 9:39 AM

## 2017-11-29 NOTE — Discharge Instructions (Signed)
Discharge Instructions: Shower per your regular routine. Do not submerge in a bath for over 6 weeks. Take tylenol and ibuprofen as needed for pain control, alternating every 4-6 hours.  Take Roxicodone for breakthrough pain. Take colace for constipation related to narcotic pain medication. No lifting > 10 lbs for 6-8 weeks. Do not pick at the staples on your incision sites. Cover the ostomy site with a dressing as needed daily. Please monitor your blood pressure; you can get automatic blood pressure monitors at the drugstore, or you can get your blood pressure taken at the drugstore.  You have been started on lisinopril for your high blood pressure. You are still having higher blood pressure than you should be having. Please follow up with your PCP as soon as possible.  Your goal BP is Less than 120/ 80.   End Colostomy Reversal, Care After Refer to this sheet in the next few weeks. These instructions provide you with information about caring for yourself after your procedure. Your health care provider may also give you more specific instructions. Your treatment has been planned according to current medical practices, but problems sometimes occur. Call your health care provider if you have any problems or questions after your procedure. What can I expect after the procedure? After the procedure, it is common to have:  Pain and discomfort in your abdomen, especially near your incision.  Loose stools.  Decreased appetite.  Follow these instructions at home: Activity  Return to your normal activities as told by your health care provider. Ask your health care provider what activities are safe for you.  Avoid strenuous activity, contact sports, and abdominal exercises for 4 weeks or as long as told by your health care provider.  Do not lift anything that is heavier than 10 lb (4.5 kg). Incision care   Follow instructions from your health care provider about how to take care of your  incision. Make sure you: ? Wash your hands with soap and water before you change your bandage (dressing). If soap and water are not available, use hand sanitizer. ? Change your dressing as told by your health care provider. ? Leave stitches (sutures), skin glue, or adhesive strips in place. These skin closures may need to stay in place for 2 weeks or longer. If adhesive strip edges start to loosen and curl up, you may trim the loose edges. Do not remove adhesive strips completely unless your health care provider tells you to do that.  Keep the incision area clean and dry.  Check your incision area every day for signs of infection. Check for: ? More redness, swelling, or pain. ? More fluid or blood. ? Warmth. ? Pus or a bad smell. Bathing  Do not take baths, swim, or use a hot tub until your health care provider approves. Ask your health care provider if you can take showers. You may only be allowed to take sponge baths for bathing.  If your health care provider approves bathing and showering, cover the bandage with a watertight plastic bag to protect it from water. Do not let the bandage get wet.  Keep the bandage (dressing) dry until your health care provider says it can be removed. Eating and drinking  Follow instructions from your health care provider about eating or drinking restrictions.  Drink enough fluid to keep your urine clear or pale yellow. Driving  Do not drive for 24 hours if you received a sedative.  Do not drive or operate heavy machinery while taking  prescription pain medicine. General instructions  Take over-the-counter and prescription medicines only as told by your health care provider.  Do not use any tobacco products, such as cigarettes, chewing tobacco, and e-cigarettes. If you need help quitting, ask your health care provider.  Keep all follow-up visits as told by your health care provider. This is important. Contact a health care provider if:  You  havemore redness, swelling, or pain at the site of your incision.  You have more fluid or blood coming from your incision.  Your incision feels warm to the touch.  You have pus or a bad smell coming from your incision.  You have a fever.  Your incision breaks open.  You keep vomiting or feeling nauseous.  You are not able to have a bowel movement (are constipated).  You have pain that is not controlled with medicine. Get help right away if:  You have a rash.  You have difficulty breathing. This information is not intended to replace advice given to you by your health care provider. Make sure you discuss any questions you have with your health care provider. Document Released: 01/21/2012 Document Revised: 04/05/2016 Document Reviewed: 07/12/2015 Elsevier Interactive Patient Education  2018 [employer]   Hypertension Hypertension is another name for high blood pressure. High blood pressure forces your heart to work harder to pump blood. This can cause problems over time. There are two numbers in a blood pressure reading. There is a top number (systolic) over a bottom number (diastolic). It is best to have a blood pressure below 120/80. Healthy choices can help lower your blood pressure. You may need medicine to help lower your blood pressure if:  Your blood pressure cannot be lowered with healthy choices.  Your blood pressure is higher than 130/80.  Follow these instructions at home: Eating and drinking  If directed, follow the DASH eating plan. This diet includes: ? Filling half of your plate at each meal with fruits and vegetables. ? Filling one quarter of your plate at each meal with whole grains. Whole grains include whole wheat pasta, brown rice, and whole grain bread. ? Eating or drinking low-fat dairy products, such as skim milk or low-fat yogurt. ? Filling one quarter of your plate at each meal with low-fat (lean) proteins. Low-fat proteins include fish, skinless  chicken, eggs, beans, and tofu. ? Avoiding fatty meat, cured and processed meat, or chicken with skin. ? Avoiding premade or processed food.  Eat less than 1,500 mg of salt (sodium) a day.  Limit alcohol use to no more than 1 drink a day for nonpregnant women and 2 drinks a day for men. One drink equals 12 oz of beer, 5 oz of wine, or 1 oz of hard liquor. Lifestyle  Work with your doctor to stay at a healthy weight or to lose weight. Ask your doctor what the best weight is for you.  Get at least 30 minutes of exercise that causes your heart to beat faster (aerobic exercise) most days of the week. This may include walking, swimming, or biking.  Get at least 30 minutes of exercise that strengthens your muscles (resistance exercise) at least 3 days a week. This may include lifting weights or pilates.  Do not use any products that contain nicotine or tobacco. This includes cigarettes and e-cigarettes. If you need help quitting, ask your doctor.  Check your blood pressure at home as told by your doctor.  Keep all follow-up visits as told by your doctor. This is  important. Medicines  Take over-the-counter and prescription medicines only as told by your doctor. Follow directions carefully.  Do not skip doses of blood pressure medicine. The medicine does not work as well if you skip doses. Skipping doses also puts you at risk for problems.  Ask your doctor about side effects or reactions to medicines that you should watch for. Contact a doctor if:  You think you are having a reaction to the medicine you are taking.  You have headaches that keep coming back (recurring).  You feel dizzy.  You have swelling in your ankles.  You have trouble with your vision. Get help right away if:  You get a very bad headache.  You start to feel confused.  You feel weak or numb.  You feel faint.  You get very bad pain in your: ? Chest. ? Belly (abdomen).  You throw up (vomit) more than  once.  You have trouble breathing. Summary  Hypertension is another name for high blood pressure.  Making healthy choices can help lower blood pressure. If your blood pressure cannot be controlled with healthy choices, you may need to take medicine. This information is not intended to replace advice given to you by your health care provider. Make sure you discuss any questions you have with your health care provider. Document Released: 04/16/2008 Document Revised: 09/26/2016 Document Reviewed: 09/26/2016 Elsevier Interactive Patient Education  2018 [employer].  Lisinopril tablets What is this medicine? LISINOPRIL (lyse IN oh pril) is an ACE inhibitor. This medicine is used to treat high blood pressure and heart failure. It is also used to protect the heart immediately after a heart attack. This medicine may be used for other purposes; ask your health care provider or pharmacist if you have questions. COMMON BRAND NAME(S): Prinivil, Zestril What should I tell my health care provider before I take this medicine? They need to know if you have any of these conditions: -diabetes -heart or blood vessel disease -kidney disease -low blood pressure -previous swelling of the tongue, face, or lips with difficulty breathing, difficulty swallowing, hoarseness, or tightening of the throat -an unusual or allergic reaction to lisinopril, other ACE inhibitors, insect venom, foods, dyes, or preservatives -pregnant or trying to get pregnant -breast-feeding How should I use this medicine? Take this medicine by mouth with a glass of water. Follow the directions on your prescription label. You may take this medicine with or without food. If it upsets your stomach, take it with food. Take your medicine at regular intervals. Do not take it more often than directed. Do not stop taking except on your doctor's advice. Talk to your pediatrician regarding the use of this medicine in children. Special care may be  needed. While this drug may be prescribed for children as young as 51 years of age for selected conditions, precautions do apply. Overdosage: If you think you have taken too much of this medicine contact a poison control center or emergency room at once. NOTE: This medicine is only for you. Do not share this medicine with others. What if I miss a dose? If you miss a dose, take it as soon as you can. If it is almost time for your next dose, take only that dose. Do not take double or extra doses. What may interact with this medicine? Do not take this medicine with any of the following medications: -hymenoptera venom -sacubitril; valsartan This medicines may also interact with the following medications: -aliskiren -angiotensin receptor blockers, like losartan or valsartan -  certain medicines for diabetes -diuretics -everolimus -gold compounds -lithium -NSAIDs, medicines for pain and inflammation, like ibuprofen or naproxen -potassium salts or supplements -salt substitutes -sirolimus -temsirolimus This list may not describe all possible interactions. Give your health care provider a list of all the medicines, herbs, non-prescription drugs, or dietary supplements you use. Also tell them if you smoke, drink alcohol, or use illegal drugs. Some items may interact with your medicine. What should I watch for while using this medicine? Visit your doctor or health care professional for regular check ups. Check your blood pressure as directed. Ask your doctor what your blood pressure should be, and when you should contact him or her. Do not treat yourself for coughs, colds, or pain while you are using this medicine without asking your doctor or health care professional for advice. Some ingredients may increase your blood pressure. Women should inform their doctor if they wish to become pregnant or think they might be pregnant. There is a potential for serious side effects to an unborn child. Talk to your  health care professional or pharmacist for more information. Check with your doctor or health care professional if you get an attack of severe diarrhea, nausea and vomiting, or if you sweat a lot. The loss of too much body fluid can make it dangerous for you to take this medicine. You may get drowsy or dizzy. Do not drive, use machinery, or do anything that needs mental alertness until you know how this drug affects you. Do not stand or sit up quickly, especially if you are an older patient. This reduces the risk of dizzy or fainting spells. Alcohol can make you more drowsy and dizzy. Avoid alcoholic drinks. Avoid salt substitutes unless you are told otherwise by your doctor or health care professional. What side effects may I notice from receiving this medicine? Side effects that you should report to your doctor or health care professional as soon as possible: -allergic reactions like skin rash, itching or hives, swelling of the hands, feet, face, lips, throat, or tongue -breathing problems -signs and symptoms of kidney injury like trouble passing urine or change in the amount of urine -signs and symptoms of increased potassium like muscle weakness; chest pain; or fast, irregular heartbeat -signs and symptoms of liver injury like dark yellow or brown urine; general ill feeling or flu-like symptoms; light-colored stools; loss of appetite; nausea; right upper belly pain; unusually weak or tired; yellowing of the eyes or skin -signs and symptoms of low blood pressure like dizziness; feeling faint or lightheaded, falls; unusually weak or tired -stomach pain with or without nausea and vomiting Side effects that usually do not require medical attention (report to your doctor or health care professional if they continue or are bothersome): -changes in taste -cough -dizziness -fever -headache -sensitivity to light This list may not describe all possible side effects. Call your doctor for medical advice  about side effects. You may report side effects to FDA at 1-800-FDA-1088. Where should I keep my medicine? Keep out of the reach of children. Store at room temperature between 15 and 30 degrees C (59 and 86 degrees F). Protect from moisture. Keep container tightly closed. Throw away any unused medicine after the expiration date. NOTE: This sheet is a summary. It may not cover all possible information. If you have questions about this medicine, talk to your doctor, pharmacist, or health care provider.  2018 Elsevier/Gold Standard (2015-12-19 12:52:35)

## 2017-12-02 DIAGNOSIS — Z1389 Encounter for screening for other disorder: Secondary | ICD-10-CM | POA: Diagnosis not present

## 2017-12-02 DIAGNOSIS — Z6827 Body mass index (BMI) 27.0-27.9, adult: Secondary | ICD-10-CM | POA: Diagnosis not present

## 2017-12-02 DIAGNOSIS — I10 Essential (primary) hypertension: Secondary | ICD-10-CM | POA: Diagnosis not present

## 2017-12-02 DIAGNOSIS — E663 Overweight: Secondary | ICD-10-CM | POA: Diagnosis not present

## 2017-12-12 ENCOUNTER — Ambulatory Visit (INDEPENDENT_AMBULATORY_CARE_PROVIDER_SITE_OTHER): Payer: Self-pay | Admitting: General Surgery

## 2017-12-12 ENCOUNTER — Encounter: Payer: Self-pay | Admitting: General Surgery

## 2017-12-12 VITALS — BP 150/94 | HR 97 | Temp 98.9°F | Resp 18 | Ht 71.0 in | Wt 186.0 lb

## 2017-12-12 DIAGNOSIS — Z933 Colostomy status: Secondary | ICD-10-CM

## 2017-12-12 NOTE — Progress Notes (Signed)
[facility] Note   HPI:  47 y.o. Male presents to clinic for post-op follow-up evaluation after colostomy reversal. He is doing well. He has been tolerating and diet and getting back to his activities.   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 - NO EVIDENCE OF MALIGNANCY.  Review of Systems:  No fevers or chills Very mild clear /yellow drainage from mid portion of wound  All other review of systems: otherwise negative   Vital Signs:  BP (!) 150/94   Pulse 97   Temp 98.9 F (37.2 C)   Resp 18   Ht 5\' 11"  (1.803 m)   Wt 186 lb (84.4 kg)   BMI 25.94 kg/m    Physical Exam:  Physical Exam  Constitutional: He is well-developed, well-nourished, and in no distress.  HENT:  Head: Normocephalic.  Eyes: Pupils are equal, round, and reactive to light.  Cardiovascular: Normal rate.  Pulmonary/Chest: Effort normal.  Abdominal: Soft. He exhibits no distension. There is no tenderness.  Staples removed, minimal erythema just around the staples, reactive, serous drainage, nothing expressed, ostomy site clean and intact, staples removed; steris applied   Vitals reviewed.   Laboratory studies: None    Imaging:  None   Assessment:  47 y.o. yo Male s/p colostomy reversal. He is doing great. His BP is better controlled on 2 lisinopril.  Plan:  - Follow up PRN  - Steris will follow off once peeling up - Watch out for worsening redness, or drainage, call  - No lifting > 10 lbs for 8 weeks  - Follow up with Avis EpleyJackson, Samantha J, PA-C for HTN check and meds  All of the above recommendations were discussed with the patient, and all of patient's questions were answered to his expressed satisfaction.  Algis GreenhouseLindsay Bridges, MD [facility] [address] [place], [place] [id number] [phone] (office)

## 2018-02-12 ENCOUNTER — Encounter: Payer: Self-pay | Admitting: Internal Medicine

## 2018-04-23 ENCOUNTER — Other Ambulatory Visit (HOSPITAL_COMMUNITY): Payer: Self-pay | Admitting: Family Medicine

## 2018-04-23 ENCOUNTER — Ambulatory Visit (HOSPITAL_COMMUNITY)
Admission: RE | Admit: 2018-04-23 | Discharge: 2018-04-23 | Disposition: A | Discharge status: Home / Self Care | Payer: BLUE CROSS/BLUE SHIELD | Source: Ambulatory Visit | Attending: Family Medicine | Admitting: Family Medicine

## 2018-04-23 DIAGNOSIS — M542 Cervicalgia: Secondary | ICD-10-CM | POA: Diagnosis not present

## 2018-04-23 DIAGNOSIS — M47812 Spondylosis without myelopathy or radiculopathy, cervical region: Secondary | ICD-10-CM | POA: Insufficient documentation

## 2018-04-23 DIAGNOSIS — Z1389 Encounter for screening for other disorder: Secondary | ICD-10-CM | POA: Insufficient documentation

## 2018-04-23 DIAGNOSIS — E663 Overweight: Secondary | ICD-10-CM | POA: Diagnosis not present

## 2018-04-23 DIAGNOSIS — Z6828 Body mass index (BMI) 28.0-28.9, adult: Secondary | ICD-10-CM | POA: Diagnosis not present

## 2018-04-23 DIAGNOSIS — Z719 Counseling, unspecified: Secondary | ICD-10-CM | POA: Diagnosis not present

## 2018-04-23 DIAGNOSIS — M5020 Other cervical disc displacement, unspecified cervical region: Secondary | ICD-10-CM | POA: Diagnosis not present

## 2018-04-23 DIAGNOSIS — M25512 Pain in left shoulder: Secondary | ICD-10-CM | POA: Diagnosis not present

## 2018-04-23 DIAGNOSIS — M5412 Radiculopathy, cervical region: Secondary | ICD-10-CM

## 2018-04-28 DIAGNOSIS — M546 Pain in thoracic spine: Secondary | ICD-10-CM | POA: Diagnosis not present

## 2018-04-28 DIAGNOSIS — M47812 Spondylosis without myelopathy or radiculopathy, cervical region: Secondary | ICD-10-CM | POA: Diagnosis not present

## 2018-04-28 DIAGNOSIS — M531 Cervicobrachial syndrome: Secondary | ICD-10-CM | POA: Diagnosis not present

## 2018-04-29 DIAGNOSIS — M546 Pain in thoracic spine: Secondary | ICD-10-CM | POA: Diagnosis not present

## 2018-04-29 DIAGNOSIS — M531 Cervicobrachial syndrome: Secondary | ICD-10-CM | POA: Diagnosis not present

## 2018-04-29 DIAGNOSIS — M47812 Spondylosis without myelopathy or radiculopathy, cervical region: Secondary | ICD-10-CM | POA: Diagnosis not present

## 2018-05-01 DIAGNOSIS — M531 Cervicobrachial syndrome: Secondary | ICD-10-CM | POA: Diagnosis not present

## 2018-05-01 DIAGNOSIS — M47812 Spondylosis without myelopathy or radiculopathy, cervical region: Secondary | ICD-10-CM | POA: Diagnosis not present

## 2018-05-01 DIAGNOSIS — M546 Pain in thoracic spine: Secondary | ICD-10-CM | POA: Diagnosis not present

## 2018-05-08 DIAGNOSIS — M546 Pain in thoracic spine: Secondary | ICD-10-CM | POA: Diagnosis not present

## 2018-05-08 DIAGNOSIS — M531 Cervicobrachial syndrome: Secondary | ICD-10-CM | POA: Diagnosis not present

## 2018-05-08 DIAGNOSIS — M47812 Spondylosis without myelopathy or radiculopathy, cervical region: Secondary | ICD-10-CM | POA: Diagnosis not present

## 2018-09-26 DIAGNOSIS — Z23 Encounter for immunization: Secondary | ICD-10-CM | POA: Diagnosis not present

## 2018-12-03 DIAGNOSIS — I10 Essential (primary) hypertension: Secondary | ICD-10-CM | POA: Diagnosis not present

## 2018-12-03 DIAGNOSIS — Z681 Body mass index (BMI) 19 or less, adult: Secondary | ICD-10-CM | POA: Diagnosis not present

## 2019-11-26 DIAGNOSIS — Z1389 Encounter for screening for other disorder: Secondary | ICD-10-CM | POA: Diagnosis not present

## 2019-11-26 DIAGNOSIS — E663 Overweight: Secondary | ICD-10-CM | POA: Diagnosis not present

## 2019-11-26 DIAGNOSIS — Z719 Counseling, unspecified: Secondary | ICD-10-CM | POA: Diagnosis not present

## 2019-11-26 DIAGNOSIS — Z0001 Encounter for general adult medical examination with abnormal findings: Secondary | ICD-10-CM | POA: Diagnosis not present

## 2019-11-26 DIAGNOSIS — E7849 Other hyperlipidemia: Secondary | ICD-10-CM | POA: Diagnosis not present

## 2019-11-26 DIAGNOSIS — R000 Tachycardia, unspecified: Secondary | ICD-10-CM | POA: Diagnosis not present

## 2019-11-26 DIAGNOSIS — Z6828 Body mass index (BMI) 28.0-28.9, adult: Secondary | ICD-10-CM | POA: Diagnosis not present

## 2020-02-08 ENCOUNTER — Ambulatory Visit: Payer: Self-pay | Attending: Internal Medicine

## 2020-02-08 ENCOUNTER — Ambulatory Visit: Payer: BLUE CROSS/BLUE SHIELD

## 2020-02-08 DIAGNOSIS — Z23 Encounter for immunization: Secondary | ICD-10-CM

## 2020-02-08 NOTE — Progress Notes (Signed)
   Covid-19 Vaccination Clinic  Name:  Steven Mcbride    MRN: [id number] DOB: 08/22/1971  02/08/2020  Steven Mcbride was observed post Covid-19 immunization for 15 minutes without incident. He was provided with Vaccine Information Sheet and instruction to access the V-Safe system.   Steven Mcbride was instructed to call 911 with any severe reactions post vaccine: Marland Kitchen Difficulty breathing  . Swelling of face and throat  . A fast heartbeat  . A bad rash all over body  . Dizziness and weakness   Immunizations Administered    Name Date Dose VIS Date Route   Pfizer COVID-19 Vaccine 02/08/2020 11:15 AM 0.3 mL 10/23/2019 Intramuscular   Manufacturer: [employer], [employer]   Lot: [id number]   NDC: [address]-1

## 2020-02-10 DIAGNOSIS — E663 Overweight: Secondary | ICD-10-CM | POA: Diagnosis not present

## 2020-02-10 DIAGNOSIS — Z6828 Body mass index (BMI) 28.0-28.9, adult: Secondary | ICD-10-CM | POA: Diagnosis not present

## 2020-02-10 DIAGNOSIS — M2392 Unspecified internal derangement of left knee: Secondary | ICD-10-CM | POA: Diagnosis not present

## 2020-02-10 DIAGNOSIS — M25562 Pain in left knee: Secondary | ICD-10-CM | POA: Diagnosis not present

## 2020-03-02 ENCOUNTER — Ambulatory Visit: Payer: Self-pay | Attending: Internal Medicine

## 2020-03-02 DIAGNOSIS — Z23 Encounter for immunization: Secondary | ICD-10-CM

## 2020-03-02 NOTE — Progress Notes (Signed)
   Covid-19 Vaccination Clinic  Name:  Steven Mcbride    MRN: [id number] DOB: 30-Jun-1971  03/02/2020  Steven Mcbride was observed post Covid-19 immunization for 15 minutes without incident. He was provided with Vaccine Information Sheet and instruction to access the V-Safe system.   Steven Mcbride was instructed to call 911 with any severe reactions post vaccine: Marland Kitchen Difficulty breathing  . Swelling of face and throat  . A fast heartbeat  . A bad rash all over body  . Dizziness and weakness   Immunizations Administered    Name Date Dose VIS Date Route   Pfizer COVID-19 Vaccine 03/02/2020  9:16 AM 0.3 mL 01/06/2019 Intramuscular   Manufacturer: [employer], [employer]   Lot: [id number]   NDC: [address]-9

## 2020-03-14 DIAGNOSIS — M25562 Pain in left knee: Secondary | ICD-10-CM | POA: Diagnosis not present

## 2020-03-25 DIAGNOSIS — M25562 Pain in left knee: Secondary | ICD-10-CM | POA: Diagnosis not present

## 2020-04-04 DIAGNOSIS — M1712 Unilateral primary osteoarthritis, left knee: Secondary | ICD-10-CM | POA: Diagnosis not present

## 2020-04-15 DIAGNOSIS — M1712 Unilateral primary osteoarthritis, left knee: Secondary | ICD-10-CM | POA: Diagnosis not present

## 2020-05-06 DIAGNOSIS — M1712 Unilateral primary osteoarthritis, left knee: Secondary | ICD-10-CM | POA: Insufficient documentation

## 2020-05-10 DIAGNOSIS — M1712 Unilateral primary osteoarthritis, left knee: Secondary | ICD-10-CM | POA: Diagnosis not present

## 2020-05-17 DIAGNOSIS — M1712 Unilateral primary osteoarthritis, left knee: Secondary | ICD-10-CM | POA: Diagnosis not present

## 2020-05-24 DIAGNOSIS — M1712 Unilateral primary osteoarthritis, left knee: Secondary | ICD-10-CM | POA: Diagnosis not present

## 2020-07-05 DIAGNOSIS — M1712 Unilateral primary osteoarthritis, left knee: Secondary | ICD-10-CM | POA: Diagnosis not present

## 2020-12-24 ENCOUNTER — Encounter: Payer: Self-pay | Admitting: Emergency Medicine

## 2020-12-24 ENCOUNTER — Ambulatory Visit
Admission: EM | Admit: 2020-12-24 | Discharge: 2020-12-24 | Disposition: A | Discharge status: Home / Self Care | Payer: BC Managed Care – PPO | Attending: Emergency Medicine | Admitting: Emergency Medicine

## 2020-12-24 ENCOUNTER — Other Ambulatory Visit: Payer: Self-pay

## 2020-12-24 DIAGNOSIS — M109 Gout, unspecified: Secondary | ICD-10-CM

## 2020-12-24 HISTORY — DX: Essential (primary) hypertension: I10

## 2020-12-24 MED ORDER — COLCHICINE 0.6 MG PO TABS: ORAL_TABLET | ORAL | 0 refills | Status: DC

## 2020-12-24 MED ORDER — ALLOPURINOL 100 MG PO TABS: 100.0000 mg | ORAL_TABLET | Freq: Every day | ORAL | 0 refills | Status: DC

## 2020-12-24 MED ORDER — KETOROLAC TROMETHAMINE 60 MG/2ML IM SOLN
60.0000 mg | Freq: Once | INTRAMUSCULAR | Status: AC
  Administered 2020-12-24: 60 mg via INTRAMUSCULAR

## 2020-12-24 NOTE — ED Triage Notes (Signed)
Pain to RT great toe that started yesterday.  Pt has hx of gout and states this is what it feels like.

## 2020-12-24 NOTE — Discharge Instructions (Addendum)
Prescribed colchicine take as directed and to completion °Prescribed allopurinol take as directed °Primary care provider assistance initiated to establish care °Follow up with PCP for further evaluation and management °Return or go to the ED if you have any new or worsening symptoms  °

## 2020-12-24 NOTE — ED Provider Notes (Signed)
[facility]   [id number] 12/24/20 Arrival Time: 0819  Chief Complaint  Patient presents with  . Gout     SUBJECTIVE: History from: patient.  Steven Mcbride is a 50 y.o. male with history of gout presented to the urgent care for complaint of right great toe pain that started yesterday.  Denies any precipitating event, trauma or injury.  Localized pain to the right great toe.  He describes the pain as constant and achy.  He has tried OTC medications without relief.  His symptoms are made worse with ROM.  He denies similar symptoms in the past.  Denies chills, fever, nausea, vomiting, diarrhea   ROS: As per HPI.  All other pertinent ROS negative.     Past Medical History:  Diagnosis Date  . Diverticulitis   . Gout   . Hypertension    Past Surgical History:  Procedure Laterality Date  . BIOPSY  09/16/2017   Procedure: BIOPSY;  Surgeon: Corbin Ade, MD;  Location: [facility];  Service: Endoscopy;;  sigmoid colon;  . COLONOSCOPY  09/15/2017   Dr. Jena Gauss: scattered small and large-mouthed diverticula were found in sigmoid and descending colon. Benign-appearing, intrinsic severe stenosis in sigmoid colon that was chronically inflamed appearing, fibrotic, hypertrophied mucosa involving 15 cm segment of proximal sigmoid colon. Produced significant narrowing. Upstream in colon was formed and liquid stool, precluding entire evaluation.   . COLONOSCOPY WITH PROPOFOL N/A 09/16/2017   Procedure: COLONOSCOPY WITH PROPOFOL;  Surgeon: Corbin Ade, MD;  Location: [facility];  Service: Endoscopy;  Laterality: N/A;  9:15am  . COLOSTOMY N/A 09/17/2017   Procedure: COLOSTOMY;  Surgeon: Lucretia Roers, MD;  Location: [facility];  Service: General;  Laterality: N/A;  . COLOSTOMY REVERSAL N/A 11/25/2017   Procedure: COLOSTOMY REVERSAL;  Surgeon: Lucretia Roers, MD;  Location: [facility];  Service: General;  Laterality: N/A;  . None    . PARTIAL COLECTOMY N/A 09/17/2017   Procedure:  SIGMOID  COLECTOMY;  Surgeon: Lucretia Roers, MD;  Location: [facility];  Service: General;  Laterality: N/A;   No Known Allergies No current facility-administered medications on file prior to encounter.   Current Outpatient Medications on File Prior to Encounter  Medication Sig Dispense Refill  . acetaminophen (TYLENOL) 325 MG tablet Take 650 mg by mouth every 6 (six) hours as needed for mild pain.    Marland Kitchen docusate sodium (COLACE) 100 MG capsule Take 1 capsule (100 mg total) by mouth 2 (two) times daily as needed for mild constipation. 30 capsule 0  . indomethacin (INDOCIN) 50 MG capsule take 50 mg by mouth three times a day if needed gout flares  0  . lisinopril (PRINIVIL,ZESTRIL) 10 MG tablet Take 1 tablet (10 mg total) by mouth daily. 30 tablet 0  . ondansetron (ZOFRAN-ODT) 4 MG disintegrating tablet Take 1 tablet (4 mg total) by mouth every 6 (six) hours as needed for nausea. 10 tablet 0  . oxyCODONE (OXY IR/ROXICODONE) 5 MG immediate release tablet Take 1-2 tablets (5-10 mg total) by mouth every 4 (four) hours as needed for severe pain or breakthrough pain. 40 tablet 0   Social History   Socioeconomic History  . Marital status: Married    Spouse name: Not on file  . Number of children: Not on file  . Years of education: Not on file  . Highest education level: Not on file  Occupational History  . Not on file  Tobacco Use  . Smoking status: Current  Every Day Smoker    Packs/day: 0.50    Years: 20.00    Pack years: 10.00    Types: Cigarettes  . Smokeless tobacco: Never Used  Vaping Use  . Vaping Use: Never used  Substance and Sexual Activity  . Alcohol use: Yes    Comment: 4x/week-3-4 beers, glasses of wine, or mixed drinks.  . Drug use: No  . Sexual activity: Yes    Birth control/protection: None  Other Topics Concern  . Not on file  Social History Narrative  . Not on file   Social Determinants of Health   Financial Resource Strain: Not on file  Food Insecurity: Not on  file  Transportation Needs: Not on file  Physical Activity: Not on file  Stress: Not on file  Social Connections: Not on file  Intimate Partner Violence: Not on file   Family History  Problem Relation Age of Onset  . Hypertension Father   . Lung cancer Father   . Colon cancer Neg Hx     OBJECTIVE:  Vitals:   12/24/20 0831 12/24/20 0832  BP: (!) 176/107   Pulse: 85   Resp: 18   Temp: 98.1 F (36.7 C)   TempSrc: Oral   SpO2: 98%   Weight:  200 lb (90.7 kg)  Height:  5\' 11"  (1.803 m)     Physical Exam Vitals and nursing note reviewed.  Constitutional:      General: He is not in acute distress.    Appearance: Normal appearance. He is normal weight. He is not ill-appearing, toxic-appearing or diaphoretic.  Cardiovascular:     Rate and Rhythm: Normal rate and regular rhythm.     Pulses: Normal pulses.     Heart sounds: Normal heart sounds. No murmur heard. No friction rub. No gallop.   Pulmonary:     Effort: Pulmonary effort is normal. No respiratory distress.     Breath sounds: Normal breath sounds. No stridor. No wheezing, rhonchi or rales.  Chest:     Chest wall: No tenderness.  Musculoskeletal:        General: Tenderness present.     Right foot: Swelling, deformity and tenderness present. No laceration.     Left foot: Normal.     Comments: Warmth present  Neurological:     Mental Status: He is alert and oriented to person, place, and time.      LABS:  No results found for this or any previous visit (from the past 24 hour(s)).   ASSESSMENT & PLAN:  1. Acute gout involving toe of right foot, unspecified cause     Meds ordered this encounter  Medications  . ketorolac (TORADOL) injection 60 mg  . colchicine 0.6 MG tablet    Sig: Take 2 tablet by mouth at onset, and then take 1 tablet 1 hour later.  Take 1 tablet twice daily for 2 more days as needed for gout flare    Dispense:  30 tablet    Refill:  0  . allopurinol (ZYLOPRIM) 100 MG tablet    Sig:  Take 1 tablet (100 mg total) by mouth daily.    Dispense:  30 tablet    Refill:  0    Discharge instructions  Prescribed colchicine take as directed and to completion Prescribed allopurinol take as directed Primary care provider assistance initiated to establish care Follow up with PCP for further evaluation and management Return or go to the ED if you have any new or worsening symptoms  Reviewed expectations re: course of current medical issues. Questions answered. Outlined signs and symptoms indicating need for more acute intervention. Patient verbalized understanding. After Visit Summary given.         Durward Parcel, FNP 12/24/20 [phone]

## 2021-04-14 DIAGNOSIS — I10 Essential (primary) hypertension: Secondary | ICD-10-CM | POA: Diagnosis not present

## 2021-04-14 DIAGNOSIS — E663 Overweight: Secondary | ICD-10-CM | POA: Diagnosis not present

## 2021-04-14 DIAGNOSIS — E7849 Other hyperlipidemia: Secondary | ICD-10-CM | POA: Diagnosis not present

## 2021-04-14 DIAGNOSIS — Z6829 Body mass index (BMI) 29.0-29.9, adult: Secondary | ICD-10-CM | POA: Diagnosis not present

## 2021-04-14 DIAGNOSIS — Z125 Encounter for screening for malignant neoplasm of prostate: Secondary | ICD-10-CM | POA: Diagnosis not present

## 2021-04-14 DIAGNOSIS — Z1389 Encounter for screening for other disorder: Secondary | ICD-10-CM | POA: Diagnosis not present

## 2021-04-14 DIAGNOSIS — F17210 Nicotine dependence, cigarettes, uncomplicated: Secondary | ICD-10-CM | POA: Diagnosis not present

## 2021-04-14 DIAGNOSIS — Z1331 Encounter for screening for depression: Secondary | ICD-10-CM | POA: Diagnosis not present

## 2021-11-14 ENCOUNTER — Encounter: Payer: Self-pay | Admitting: Emergency Medicine

## 2021-11-14 ENCOUNTER — Ambulatory Visit
Admission: EM | Admit: 2021-11-14 | Discharge: 2021-11-14 | Disposition: A | Discharge status: Home / Self Care | Payer: BC Managed Care – PPO | Attending: Urgent Care | Admitting: Urgent Care

## 2021-11-14 ENCOUNTER — Ambulatory Visit (INDEPENDENT_AMBULATORY_CARE_PROVIDER_SITE_OTHER): Payer: BC Managed Care – PPO

## 2021-11-14 ENCOUNTER — Other Ambulatory Visit: Payer: Self-pay

## 2021-11-14 DIAGNOSIS — M25571 Pain in right ankle and joints of right foot: Secondary | ICD-10-CM

## 2021-11-14 DIAGNOSIS — Z8739 Personal history of other diseases of the musculoskeletal system and connective tissue: Secondary | ICD-10-CM

## 2021-11-14 DIAGNOSIS — M25471 Effusion, right ankle: Secondary | ICD-10-CM

## 2021-11-14 DIAGNOSIS — M7989 Other specified soft tissue disorders: Secondary | ICD-10-CM | POA: Diagnosis not present

## 2021-11-14 DIAGNOSIS — S93401A Sprain of unspecified ligament of right ankle, initial encounter: Secondary | ICD-10-CM | POA: Diagnosis not present

## 2021-11-14 MED ORDER — COLCHICINE 0.6 MG PO TABS: ORAL_TABLET | ORAL | 5 refills | Status: AC

## 2021-11-14 MED ORDER — NAPROXEN 500 MG PO TABS: 500.0000 mg | ORAL_TABLET | Freq: Two times a day (BID) | ORAL | 0 refills | Status: DC

## 2021-11-14 NOTE — ED Provider Notes (Signed)
[facility]   MRN: [id number] DOB: [birth year]  Subjective:   Steven Mcbride is a 51 y.o. male presenting for 10-day history of acute onset persistent and worsening right ankle pain with swelling.  Patient has a history of gout but has not had a flareup in about a year.  Usually gets it over his toe.  He did roll his ankle on Christmas Day.  Has been trying to elevate and and has been using Tylenol.  Would like a refill of his Colcrys as well for any future gout attacks.  No current facility-administered medications for this encounter.  Current Outpatient Medications:    acetaminophen (TYLENOL) 325 MG tablet, Take 650 mg by mouth every 6 (six) hours as needed for mild pain., Disp: , Rfl:    allopurinol (ZYLOPRIM) 100 MG tablet, Take 1 tablet (100 mg total) by mouth daily., Disp: 30 tablet, Rfl: 0   colchicine 0.6 MG tablet, Take 2 tablet by mouth at onset, and then take 1 tablet 1 hour later.  Take 1 tablet twice daily for 2 more days as needed for gout flare, Disp: 30 tablet, Rfl: 0   docusate sodium (COLACE) 100 MG capsule, Take 1 capsule (100 mg total) by mouth 2 (two) times daily as needed for mild constipation., Disp: 30 capsule, Rfl: 0   indomethacin (INDOCIN) 50 MG capsule, take 50 mg by mouth three times a day if needed gout flares, Disp: , Rfl: 0   lisinopril (PRINIVIL,ZESTRIL) 10 MG tablet, Take 1 tablet (10 mg total) by mouth daily., Disp: 30 tablet, Rfl: 0   ondansetron (ZOFRAN-ODT) 4 MG disintegrating tablet, Take 1 tablet (4 mg total) by mouth every 6 (six) hours as needed for nausea., Disp: 10 tablet, Rfl: 0   oxyCODONE (OXY IR/ROXICODONE) 5 MG immediate release tablet, Take 1-2 tablets (5-10 mg total) by mouth every 4 (four) hours as needed for severe pain or breakthrough pain., Disp: 40 tablet, Rfl: 0   No Known Allergies  Past Medical History:  Diagnosis Date   Diverticulitis    Gout    Hypertension      Past Surgical History:  Procedure Laterality  Date   BIOPSY  09/16/2017   Procedure: BIOPSY;  Surgeon: Corbin Ade, MD;  [facility]: [facility];  Service: Endoscopy;;  sigmoid colon;   COLONOSCOPY  09/15/2017   Dr. Jena Gauss: scattered small and large-mouthed diverticula were found in sigmoid and descending colon. Benign-appearing, intrinsic severe stenosis in sigmoid colon that was chronically inflamed appearing, fibrotic, hypertrophied mucosa involving 15 cm segment of proximal sigmoid colon. Produced significant narrowing. Upstream in colon was formed and liquid stool, precluding entire evaluation.    COLONOSCOPY WITH PROPOFOL N/A 09/16/2017   Procedure: COLONOSCOPY WITH PROPOFOL;  Surgeon: Corbin Ade, MD;  Location: [facility];  Service: Endoscopy;  Laterality: N/A;  9:15am   COLOSTOMY N/A 09/17/2017   Procedure: COLOSTOMY;  Surgeon: Lucretia Roers, MD;  Location: [facility];  Service: General;  Laterality: N/A;   COLOSTOMY REVERSAL N/A 11/25/2017   Procedure: COLOSTOMY REVERSAL;  Surgeon: Lucretia Roers, MD;  Location: [facility];  Service: General;  Laterality: N/A;   None     PARTIAL COLECTOMY N/A 09/17/2017   Procedure: SIGMOID  COLECTOMY;  Surgeon: Lucretia Roers, MD;  Location: [facility];  Service: General;  Laterality: N/A;    Family History  Problem Relation Age of Onset   Hypertension Father    Lung cancer Father    Colon cancer Neg  Hx     Social History   Tobacco Use   Smoking status: Every Day    Packs/day: 0.50    Years: 20.00    Pack years: 10.00    Types: Cigarettes   Smokeless tobacco: Never  Vaping Use   Vaping Use: Never used  Substance Use Topics   Alcohol use: Yes    Comment: 4x/week-3-4 beers, glasses of wine, or mixed drinks.   Drug use: No    ROS   Objective:   Vitals: BP (!) 138/92 (BP Location: Right Arm)    Pulse 90    Temp 98.4 F (36.9 C) (Oral)    Resp 18    SpO2 97%   Physical Exam Constitutional:      General: He is not in acute distress.    Appearance: Normal appearance.  He is well-developed and normal weight. He is not ill-appearing, toxic-appearing or diaphoretic.  HENT:     Head: Normocephalic and atraumatic.     Right Ear: External ear normal.     Left Ear: External ear normal.     Nose: Nose normal.     Mouth/Throat:     Pharynx: Oropharynx is clear.  Eyes:     General: No scleral icterus.       Right eye: No discharge.        Left eye: No discharge.     Extraocular Movements: Extraocular movements intact.     Pupils: Pupils are equal, round, and reactive to light.  Cardiovascular:     Rate and Rhythm: Normal rate.  Pulmonary:     Effort: Pulmonary effort is normal.  Musculoskeletal:     Cervical back: Normal range of motion.     Right ankle: Swelling present. No deformity, ecchymosis or lacerations. Tenderness present over the ATF ligament and AITF ligament. No lateral malleolus, medial malleolus, CF ligament, posterior TF ligament, base of 5th metatarsal or proximal fibula tenderness. Decreased range of motion.     Right Achilles Tendon: No tenderness or defects. Thompson's test negative.     Right foot: Normal range of motion and normal capillary refill. No swelling, deformity, laceration, tenderness, bony tenderness or crepitus.  Neurological:     Mental Status: He is alert and oriented to person, place, and time.  Psychiatric:        Mood and Affect: Mood normal.        Behavior: Behavior normal.        Thought Content: Thought content normal.        Judgment: Judgment normal.    DG Ankle Complete Right  Result Date: 11/14/2021 CLINICAL DATA:  Pain and swelling after twisting injury EXAM: RIGHT ANKLE - COMPLETE 3+ VIEW COMPARISON:  12/22/2014 FINDINGS: There is no evidence of fracture, dislocation, or joint effusion. Calcaneal spur. There is no evidence of arthropathy or other focal bone abnormality. There is soft tissue swelling, more marked laterally than medially. IMPRESSION: 1. Negative for fracture or other acute bone abnormality. 2.  Soft tissue swelling suggesting  ligamentous injury. Electronically Signed   By: Corlis Leak M.D.   On: 11/14/2021 15:28    A 4 inch Ace wrap was applied to the right ankle in figure 8 method.  Assessment and Plan :   PDMP not reviewed this encounter.  1. Pain and swelling of right ankle   2. Sprain of right ankle, unspecified ligament, initial encounter   3. History of gout    Refilled his Colcrys for future gout attacks.  I  do not suspect that this is 1 as he has no warmth, erythema and is not over the toe where he normally gets gout.  Will manage for ankle sprain with rice method, NSAID. Counseled patient on potential for adverse effects with medications prescribed/recommended today, ER and return-to-clinic precautions discussed, patient verbalized understanding.    Wallis BambergMani, Abisai Deer, PA-C 11/14/21 1556

## 2021-11-14 NOTE — ED Triage Notes (Signed)
Patient thinks it may be a gout flare up in his foot.

## 2021-11-14 NOTE — ED Triage Notes (Signed)
Rolled ankle on Christmas day.  Pain and swelling to right ankle

## 2022-01-17 IMAGING — DX DG ANKLE COMPLETE 3+V*R*
3 series · 3 of 3 positions shown · non-contrast
Comparison: 12/22/2014

CLINICAL DATA: Pain and swelling after twisting injury

EXAM:
RIGHT ANKLE - COMPLETE 3+ VIEW

[ankle ap]
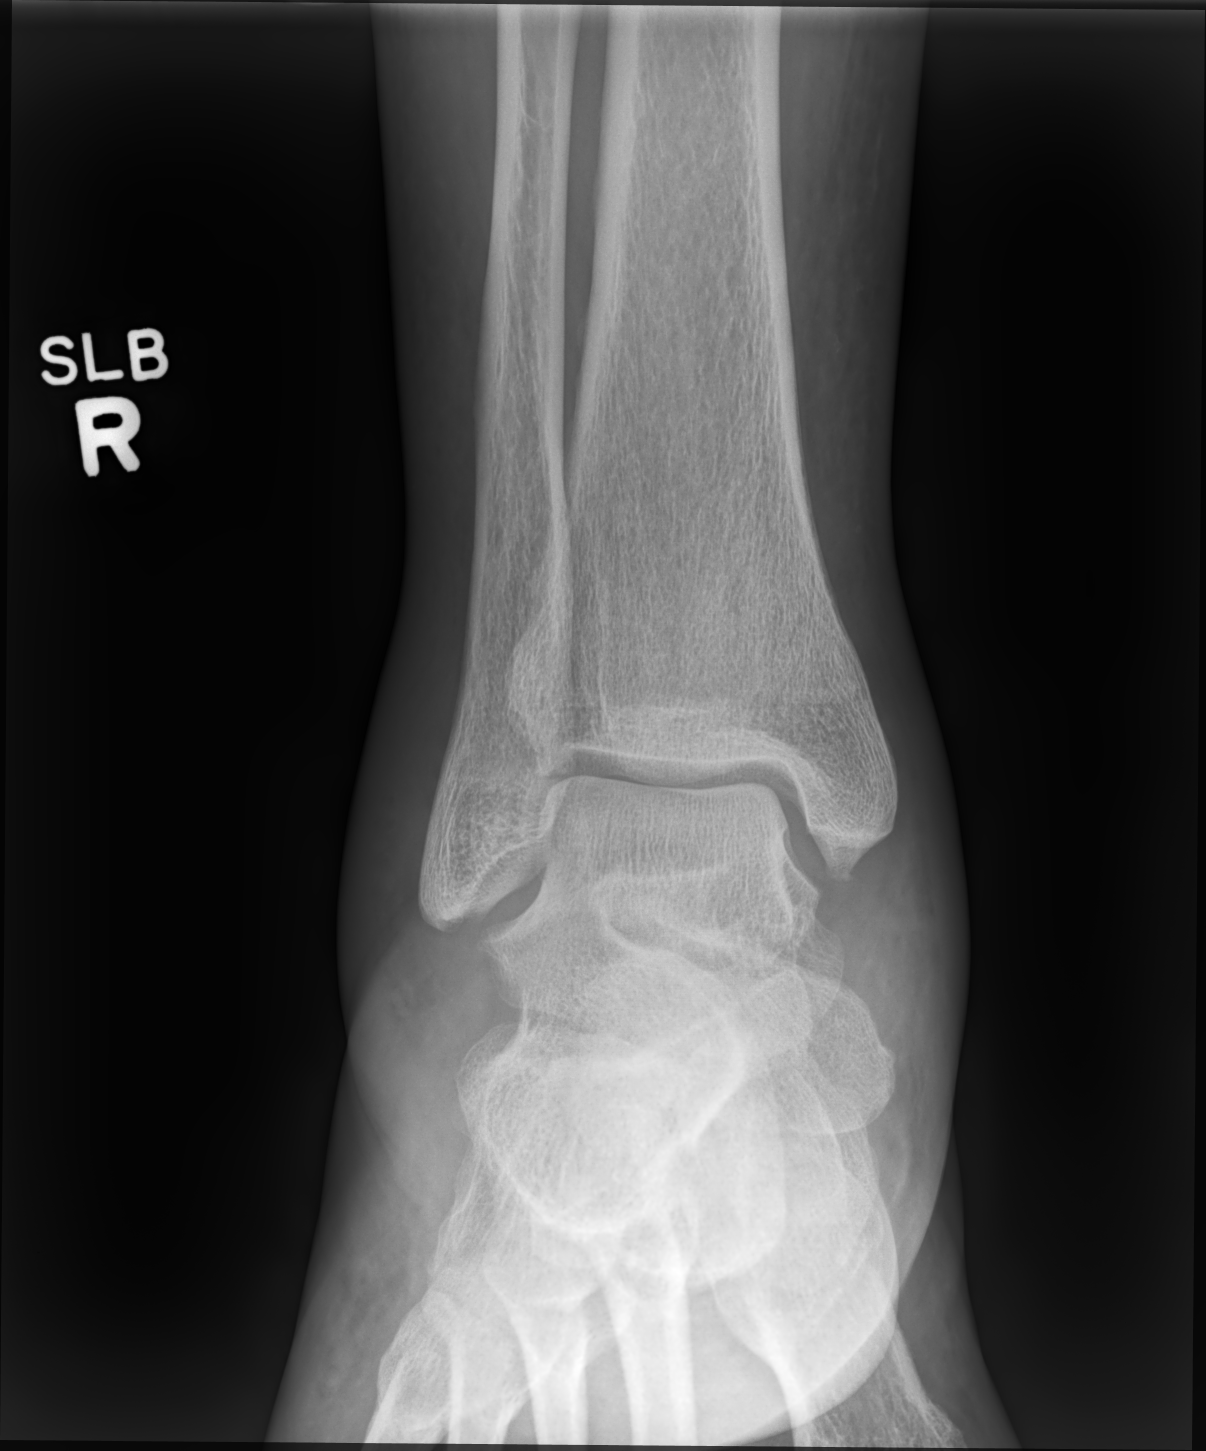

[ankle mlo]
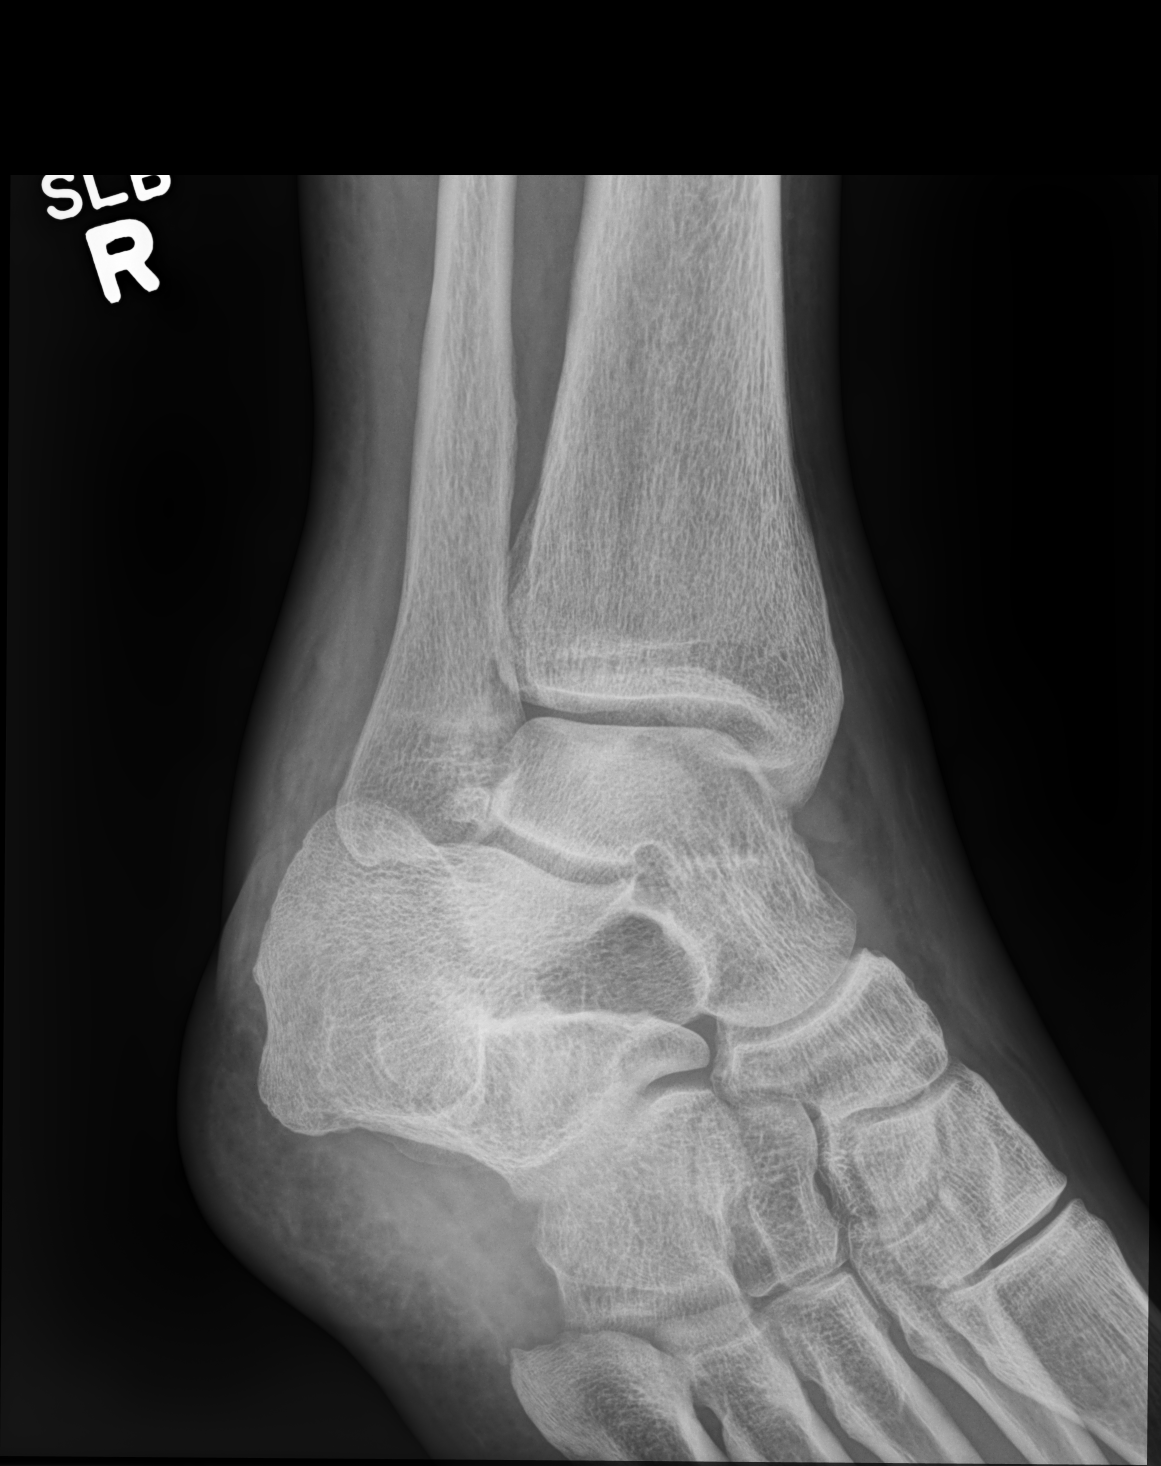

[ankle lat]
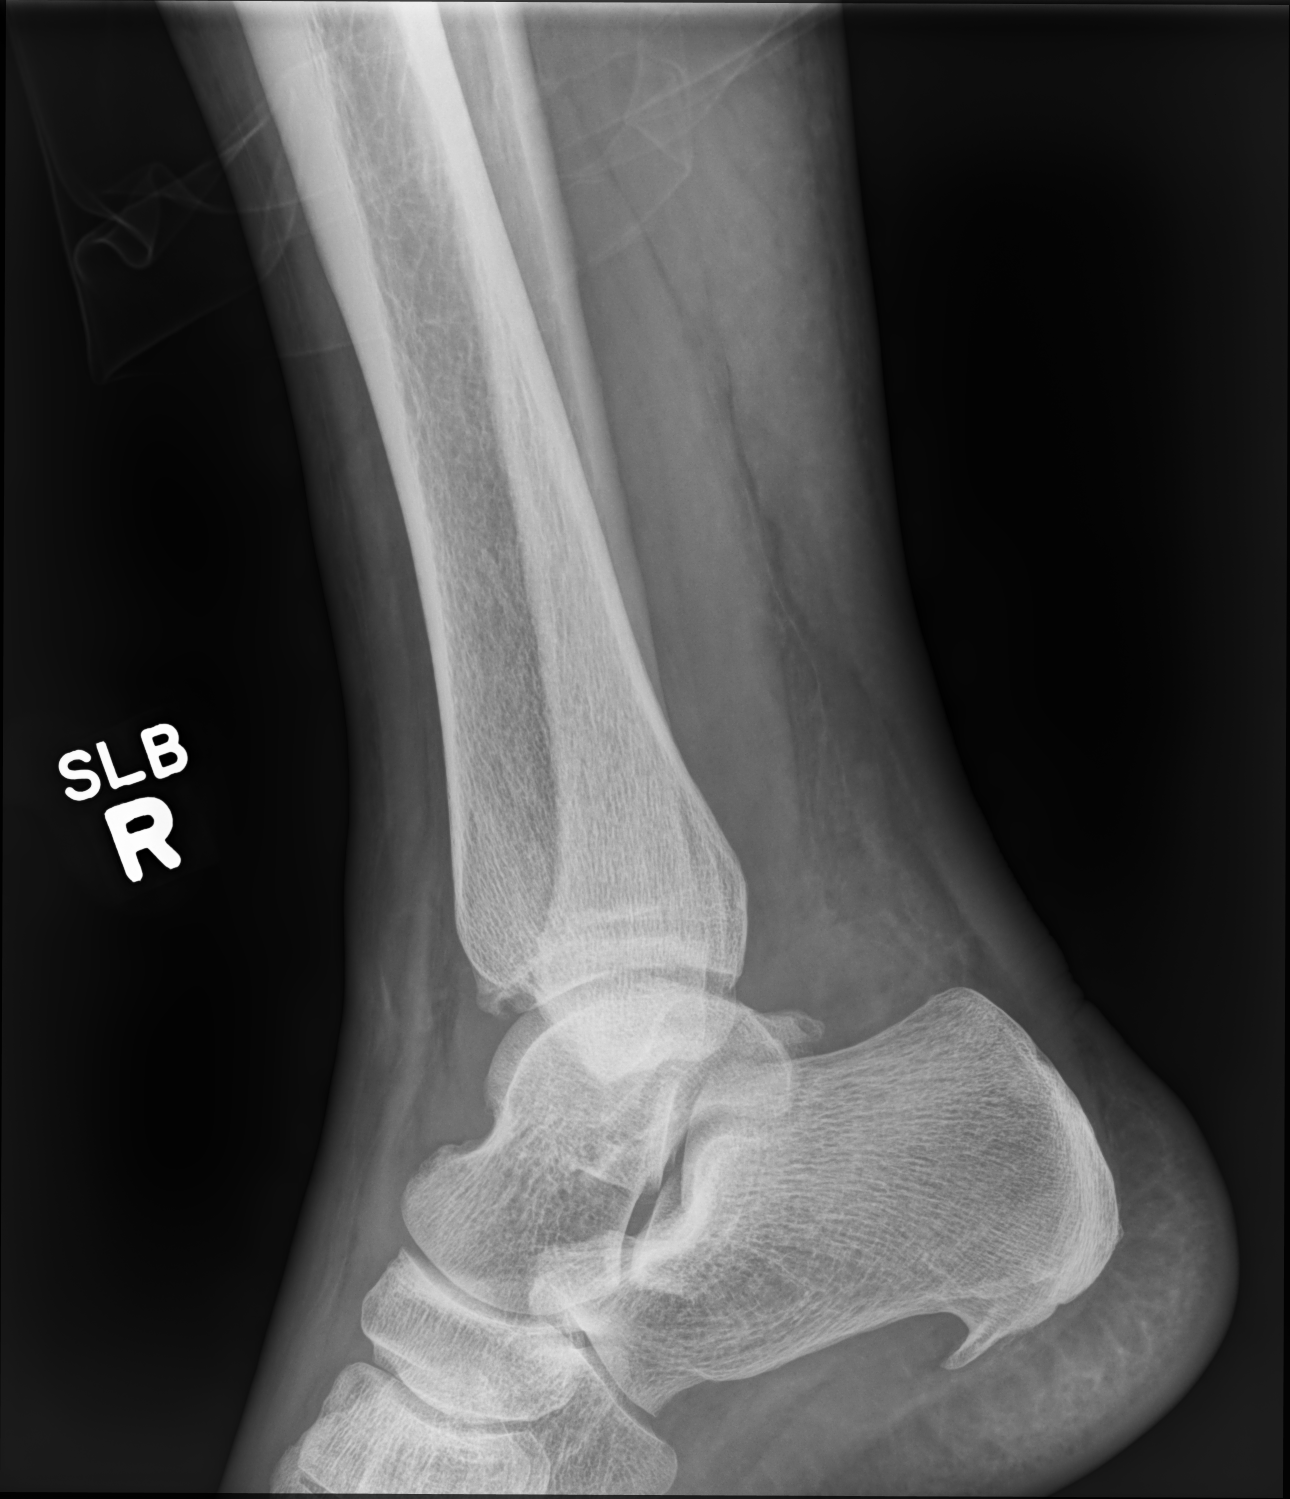

[3 of 3 positions shown; findings below may reference images not displayed]

FINDINGS: There is no evidence of fracture, dislocation, or joint effusion.
Calcaneal spur. There is no evidence of arthropathy or other focal
bone abnormality. There is soft tissue swelling, more marked
laterally than medially.
IMPRESSION: 1. Negative for fracture or other acute bone abnormality.
2. Soft tissue swelling suggesting  ligamentous injury.

## 2022-02-08 DIAGNOSIS — E7849 Other hyperlipidemia: Secondary | ICD-10-CM | POA: Diagnosis not present

## 2022-02-08 DIAGNOSIS — E663 Overweight: Secondary | ICD-10-CM | POA: Diagnosis not present

## 2022-02-08 DIAGNOSIS — Z0000 Encounter for general adult medical examination without abnormal findings: Secondary | ICD-10-CM | POA: Diagnosis not present

## 2022-02-08 DIAGNOSIS — Z6829 Body mass index (BMI) 29.0-29.9, adult: Secondary | ICD-10-CM | POA: Diagnosis not present

## 2022-11-20 DIAGNOSIS — S0501XA Injury of conjunctiva and corneal abrasion without foreign body, right eye, initial encounter: Secondary | ICD-10-CM | POA: Diagnosis not present

## 2023-04-12 ENCOUNTER — Emergency Department (HOSPITAL_COMMUNITY)
Admission: EM | Admit: 2023-04-12 | Discharge: 2023-04-12 | Disposition: A | Discharge status: Home / Self Care | Payer: BC Managed Care – PPO | Attending: Emergency Medicine | Admitting: Emergency Medicine

## 2023-04-12 ENCOUNTER — Other Ambulatory Visit: Payer: Self-pay

## 2023-04-12 ENCOUNTER — Encounter (HOSPITAL_COMMUNITY): Payer: Self-pay | Admitting: Emergency Medicine

## 2023-04-12 ENCOUNTER — Emergency Department (HOSPITAL_COMMUNITY): Payer: BC Managed Care – PPO

## 2023-04-12 DIAGNOSIS — Z79899 Other long term (current) drug therapy: Secondary | ICD-10-CM | POA: Diagnosis not present

## 2023-04-12 DIAGNOSIS — R072 Precordial pain: Secondary | ICD-10-CM | POA: Insufficient documentation

## 2023-04-12 DIAGNOSIS — I10 Essential (primary) hypertension: Secondary | ICD-10-CM

## 2023-04-12 DIAGNOSIS — R079 Chest pain, unspecified: Secondary | ICD-10-CM | POA: Diagnosis not present

## 2023-04-12 DIAGNOSIS — F17200 Nicotine dependence, unspecified, uncomplicated: Secondary | ICD-10-CM | POA: Insufficient documentation

## 2023-04-12 LAB — CBC
HCT: 44.5 % (ref 39.0–52.0)
Hemoglobin: 15.6 g/dL (ref 13.0–17.0)
MCH: 32 pg (ref 26.0–34.0)
MCHC: 35.1 g/dL (ref 30.0–36.0)
MCV: 91.4 fL (ref 80.0–100.0)
Platelets: 222 10*3/uL (ref 150–400)
RBC: 4.87 MIL/uL (ref 4.22–5.81)
RDW: 12.1 % (ref 11.5–15.5)
WBC: 11.1 10*3/uL — ABNORMAL HIGH (ref 4.0–10.5)
nRBC: 0 % (ref 0.0–0.2)

## 2023-04-12 LAB — TROPONIN I (HIGH SENSITIVITY)
Troponin I (High Sensitivity): 4 ng/L (ref <18)
Troponin I (High Sensitivity): 4 ng/L (ref <18)

## 2023-04-12 LAB — BASIC METABOLIC PANEL
Anion gap: 11 (ref 5–15)
BUN: 11 mg/dL (ref 6–20)
CO2: 24 mmol/L (ref 22–32)
Calcium: 9 mg/dL (ref 8.9–10.3)
Chloride: 102 mmol/L (ref 98–111)
Creatinine, Ser: 0.8 mg/dL (ref 0.61–1.24)
GFR, Estimated: >60 mL/min (ref >60)
Glucose, Bld: 100 mg/dL — ABNORMAL HIGH (ref 70–99)
Potassium: 3.7 mmol/L (ref 3.5–5.1)
Sodium: 137 mmol/L (ref 135–145)

## 2023-04-12 LAB — D-DIMER, QUANTITATIVE: D-Dimer, Quant: 0.29 ug/mL-FEU (ref 0.00–0.50)

## 2023-04-12 NOTE — ED Triage Notes (Signed)
Pt via POV c/o HTN 202/115 with headache and chest pain with radiation down left arm. Hx HTN, HLD. No previous MI or PE. Pt takes BP meds but forgot to take his medication on vacation earlier this week; he restarted them on Tuesday and has been compliant for the rest of the week. Left chest pain rated 6/10 radiating down left arm with SOB. Denies nausea, diaphoresis. BP elevated in triage.

## 2023-04-12 NOTE — Discharge Instructions (Signed)
Trend your blood pressure at home.  Continue your medicines.  If still elevated come Monday contact primary care doctor for reevaluation.  Workup for the chest pain here negative but would recommend that she follow-up with cardiology because you are over the age of 49.  Also as we discussed with the headache head CT would recommend understand that you do not want to be transferred to have that done.  Return for any new or worse symptoms to include severe headache any strokelike symptoms worsening chest pain or shortness of breath.  Also evaluation for potential blood clots in the lungs here was negative by D-dimer.

## 2023-04-12 NOTE — ED Provider Notes (Signed)
[facility] Provider Note   CSN: [id number] Arrival date & time: 04/12/23  1800     History  Chief Complaint  Patient presents with   Chest Pain   Hypertension    Steven Mcbride is a 52 y.o. male.  Patient here for left-sided substernal chest pain onset of the chest pain was this morning.  Associated with some mild shortness of breath no nausea no vomiting maybe little tingling in the arm.  No leg swelling.  Never had pain like this before.  Patient has a history of hypertension was off his hypertensive meds for the past 4 days but recently restarted blood pressure still elevated.  Is associated with headache was more in the forehead area.  Very minimal headache now not severe.  Denies any visual changes denies any numbness of any significance or any extremity weakness.  Blood pressure was 169/112 upon arrival here.  Patient has a history of hypertension hyperlipidemia no history of any known cardiac disease or pulmonary embolus.  Oxygen sats were 97%.  Heart rate was around 82.  Patient states that the left-sided chest pain 6 out of 10.  Patient is an everyday smoker.       Home Medications Prior to Admission medications   Medication Sig Start Date End Date Taking? Authorizing Provider  acetaminophen (TYLENOL) 325 MG tablet Take 650 mg by mouth every 6 (six) hours as needed for mild pain.    [provider]  allopurinol (ZYLOPRIM) 100 MG tablet Take 1 tablet (100 mg total) by mouth daily. 12/24/20   Avegno, Zachery Dakins, FNP  colchicine 0.6 MG tablet Take 2 tablet by mouth at onset, and then take 1 tablet 1 hour later.  Take 1 tablet twice daily for 2 more days as needed for gout flare 11/14/21   Wallis Bamberg, PA-C  docusate sodium (COLACE) 100 MG capsule Take 1 capsule (100 mg total) by mouth 2 (two) times daily as needed for mild constipation. 11/29/17   Lucretia Roers, MD  indomethacin (INDOCIN) 50 MG capsule take 50 mg by mouth  three times a day if needed gout flares 07/19/17   [provider]  lisinopril (PRINIVIL,ZESTRIL) 10 MG tablet Take 1 tablet (10 mg total) by mouth daily. 11/30/17   Lucretia Roers, MD  naproxen (NAPROSYN) 500 MG tablet Take 1 tablet (500 mg total) by mouth 2 (two) times daily with a meal. 11/14/21   Wallis Bamberg, PA-C  ondansetron (ZOFRAN-ODT) 4 MG disintegrating tablet Take 1 tablet (4 mg total) by mouth every 6 (six) hours as needed for nausea. 11/29/17   Lucretia Roers, MD  oxyCODONE (OXY IR/ROXICODONE) 5 MG immediate release tablet Take 1-2 tablets (5-10 mg total) by mouth every 4 (four) hours as needed for severe pain or breakthrough pain. 11/29/17   Lucretia Roers, MD      Allergies    Patient has no known allergies.    Review of Systems   Review of Systems  Constitutional:  Negative for chills and fever.  HENT:  Negative for ear pain and sore throat.   Eyes:  Negative for pain and visual disturbance.  Respiratory:  Positive for shortness of breath. Negative for cough.   Cardiovascular:  Positive for chest pain. Negative for palpitations.  Gastrointestinal:  Negative for abdominal pain and vomiting.  Genitourinary:  Negative for dysuria and hematuria.  Musculoskeletal:  Negative for arthralgias and back pain.  Skin:  Negative for color change  and rash.  Neurological:  Positive for headaches. Negative for seizures and syncope.  All other systems reviewed and are negative.   Physical Exam Updated Vital Signs BP (!) 174/108   Pulse 71   Temp 98.4 F (36.9 C) (Oral)   Resp 14   Ht 1.803 m (5\' 11" )   Wt 92.5 kg   SpO2 98%   BMI 28.45 kg/m  Physical Exam Vitals and nursing note reviewed.  Constitutional:      General: He is not in acute distress.    Appearance: Normal appearance. He is well-developed.  HENT:     Head: Normocephalic and atraumatic.  Eyes:     Extraocular Movements: Extraocular movements intact.     Conjunctiva/sclera: Conjunctivae normal.      Pupils: Pupils are equal, round, and reactive to light.  Cardiovascular:     Rate and Rhythm: Normal rate and regular rhythm.     Heart sounds: No murmur heard. Pulmonary:     Effort: Pulmonary effort is normal. No respiratory distress.     Breath sounds: Normal breath sounds. No wheezing or rales.  Chest:     Chest wall: No tenderness.  Abdominal:     Palpations: Abdomen is soft.     Tenderness: There is no abdominal tenderness.  Musculoskeletal:        General: No swelling.     Cervical back: Neck supple. No rigidity.  Skin:    General: Skin is warm and dry.     Capillary Refill: Capillary refill takes less than 2 seconds.  Neurological:     General: No focal deficit present.     Mental Status: He is alert and oriented to person, place, and time.     Cranial Nerves: No cranial nerve deficit.     Sensory: No sensory deficit.     Motor: No weakness.  Psychiatric:        Mood and Affect: Mood normal.     ED Results / Procedures / Treatments   Labs (all labs ordered are listed, but only abnormal results are displayed) Labs Reviewed  BASIC METABOLIC PANEL - Abnormal; Notable for the following components:      Result Value   Glucose, Bld 100 (*)    All other components within normal limits  CBC - Abnormal; Notable for the following components:   WBC 11.1 (*)    All other components within normal limits  D-DIMER, QUANTITATIVE  TROPONIN I (HIGH SENSITIVITY)  TROPONIN I (HIGH SENSITIVITY)    EKG EKG Interpretation  Date/Time:  Friday Apr 12 2023 18:19:04 EDT Ventricular Rate:  102 PR Interval:  116 QRS Duration: 100 QT Interval:  338 QTC Calculation: 440 R Axis:   63 Text Interpretation: Sinus tachycardia Otherwise normal ECG No previous ECGs available Confirmed by Vanetta Mulders [id number]) on 04/12/2023 8:03:32 PM  Radiology DG Chest 2 View  Result Date: 04/12/2023 CLINICAL DATA:  Chest pain EXAM: CHEST - 2 VIEW COMPARISON:  Chest x-ray dated Mar 14, 2020  FINDINGS: The heart size and mediastinal contours are within normal limits. Both lungs are clear. The visualized skeletal structures are unremarkable. IMPRESSION: No active cardiopulmonary disease. Electronically Signed   By: Allegra Lai M.D.   On: 04/12/2023 19:00    Procedures Procedures    Medications Ordered in ED Medications - No data to display  ED Course/ Medical Decision Making/ A&P  Medical Decision Making Amount and/or Complexity of Data Reviewed Labs: ordered. Radiology: ordered.   Workup for the chest pain EKG without any acute findings.  Troponins x 2 were normal at 4.  D-dimer was negative patient metabolic panel without any acute abnormalities.  Electrolytes normal and renal function normal.  CBC white count 11.1 hemoglobin 15.6 platelets 222.  And chest x-ray no acute cardiac process.  Had long discussion with patient based on the headache and the elevated blood pressure did recommend head CT to be complete but patient refuses does state that the headache is very minimal.  States he will follow-up with his primary care doctor on Monday.  Blood pressure remained high here.  Will have patient continue his blood pressure medicine and not skip it like he has been doing keep a log and then contact his primary care doctor on Monday will also refer to cardiology because of the chest pain.  Patient will return for any worsening symptoms or any strokelike symptoms Final Clinical Impression(s) / ED Diagnoses Final diagnoses:  Precordial pain  Primary hypertension    Rx / DC Orders ED Discharge Orders     None         Vanetta Mulders, MD 04/12/23 2252

## 2023-04-22 DIAGNOSIS — E782 Mixed hyperlipidemia: Secondary | ICD-10-CM | POA: Diagnosis not present

## 2023-04-22 DIAGNOSIS — Z0000 Encounter for general adult medical examination without abnormal findings: Secondary | ICD-10-CM | POA: Diagnosis not present

## 2023-04-22 DIAGNOSIS — E7849 Other hyperlipidemia: Secondary | ICD-10-CM | POA: Diagnosis not present

## 2023-04-22 DIAGNOSIS — E6609 Other obesity due to excess calories: Secondary | ICD-10-CM | POA: Diagnosis not present

## 2023-04-22 DIAGNOSIS — Z1331 Encounter for screening for depression: Secondary | ICD-10-CM | POA: Diagnosis not present

## 2023-04-22 DIAGNOSIS — Z9049 Acquired absence of other specified parts of digestive tract: Secondary | ICD-10-CM | POA: Diagnosis not present

## 2023-04-22 DIAGNOSIS — I10 Essential (primary) hypertension: Secondary | ICD-10-CM | POA: Diagnosis not present

## 2023-04-22 DIAGNOSIS — Z6828 Body mass index (BMI) 28.0-28.9, adult: Secondary | ICD-10-CM | POA: Diagnosis not present

## 2023-04-22 DIAGNOSIS — K5730 Diverticulosis of large intestine without perforation or abscess without bleeding: Secondary | ICD-10-CM | POA: Diagnosis not present

## 2023-04-24 ENCOUNTER — Encounter: Payer: Self-pay | Admitting: *Deleted

## 2023-05-22 ENCOUNTER — Other Ambulatory Visit: Payer: Self-pay | Admitting: *Deleted

## 2023-05-22 ENCOUNTER — Encounter: Payer: Self-pay | Admitting: *Deleted

## 2023-05-22 ENCOUNTER — Ambulatory Visit (INDEPENDENT_AMBULATORY_CARE_PROVIDER_SITE_OTHER): Payer: BC Managed Care – PPO | Admitting: Gastroenterology

## 2023-05-22 ENCOUNTER — Encounter: Payer: Self-pay | Admitting: Gastroenterology

## 2023-05-22 VITALS — BP 149/97 | HR 86 | Temp 97.7°F | Ht 71.0 in | Wt 204.6 lb

## 2023-05-22 DIAGNOSIS — K5792 Diverticulitis of intestine, part unspecified, without perforation or abscess without bleeding: Secondary | ICD-10-CM

## 2023-05-22 DIAGNOSIS — Z1211 Encounter for screening for malignant neoplasm of colon: Secondary | ICD-10-CM

## 2023-05-22 MED ORDER — PEG 3350-KCL-NA BICARB-NACL 420 G PO SOLR: 4000.0000 mL | Freq: Once | ORAL | 0 refills | Status: AC

## 2023-05-22 NOTE — Progress Notes (Signed)
GI Office Note    Referring Provider: Assunta Found, MD Primary Care Physician:  Assunta Found, MD  Primary Gastroenterologist: Roetta Sessions, MD   Chief Complaint   Chief Complaint  Patient presents with   Colonoscopy   History of Present Illness   Steven Mcbride is a 52 y.o. male presenting today to schedule surveillance colonoscopy.   Last colonoscopy 2018. Noted to have diverticulosis of the sigmoid colon and descending colon. 15cm long benign-appearing stricture in the sigmoid colon, difficult to negotiate the scope through it. Cecum was reached but above stricture prep was poor. Biopsies benign. He presented back to ED same day after colonoscopy with CT findings of small amount of free intra-abdominal air, c/w perforated acute diverticulitis. Required sigmoid colectomy with end colostomy. He subsequently had colostomy reversal. He was advised to complete colonoscopy after recovery.  Patient states he is ready to schedule screening colonoscopy. He has been feeling great since his colon surgery. He had not realized how sick he had been before. No longer having any bowel issues or abdominal pain. BMs daily. No melena, brbpr. No ugi symptoms.    Medications   Current Outpatient Medications  Medication Sig Dispense Refill   acetaminophen (TYLENOL) 325 MG tablet Take 650 mg by mouth every 6 (six) hours as needed for mild pain.     colchicine 0.6 MG tablet Take 2 tablet by mouth at onset, and then take 1 tablet 1 hour later.  Take 1 tablet twice daily for 2 more days as needed for gout flare 30 tablet 5   olmesartan (BENICAR) 40 MG tablet Take 40 mg by mouth daily.     rosuvastatin (CRESTOR) 20 MG tablet Take 20 mg by mouth daily.     No current facility-administered medications for this visit.    Allergies   Allergies as of 05/22/2023   (No Known Allergies)    Past Medical History   Past Medical History:  Diagnosis Date   Diverticulitis    Gout    Hypertension      Past Surgical History   Past Surgical History:  Procedure Laterality Date   BIOPSY  09/16/2017   Procedure: BIOPSY;  Surgeon: Corbin Ade, MD;  Location: [facility];  Service: Endoscopy;;  sigmoid colon;   COLONOSCOPY  09/15/2017   Dr. Jena Gauss: scattered small and large-mouthed diverticula were found in sigmoid and descending colon. Benign-appearing, intrinsic severe stenosis in sigmoid colon that was chronically inflamed appearing, fibrotic, hypertrophied mucosa involving 15 cm segment of proximal sigmoid colon. Produced significant narrowing. Upstream in colon was formed and liquid stool, precluding entire evaluation.    COLONOSCOPY WITH PROPOFOL N/A 09/16/2017   Procedure: COLONOSCOPY WITH PROPOFOL;  Surgeon: Corbin Ade, MD;  Location: [facility];  Service: Endoscopy;  Laterality: N/A;  9:15am   COLOSTOMY N/A 09/17/2017   Procedure: COLOSTOMY;  Surgeon: Lucretia Roers, MD;  Location: [facility];  Service: General;  Laterality: N/A;   COLOSTOMY REVERSAL N/A 11/25/2017   Procedure: COLOSTOMY REVERSAL;  Surgeon: Lucretia Roers, MD;  Location: [facility];  Service: General;  Laterality: N/A;   None     PARTIAL COLECTOMY N/A 09/17/2017   Procedure: SIGMOID  COLECTOMY;  Surgeon: Lucretia Roers, MD;  Location: [facility];  Service: General;  Laterality: N/A;    Past Family History   Family History  Problem Relation Age of Onset   Hypertension Father    Lung cancer Father    Colon cancer Neg  Hx     Past Social History   Social History   Socioeconomic History   Marital status: Married    Spouse name: Not on file   Number of children: Not on file   Years of education: Not on file   Highest education level: Not on file  Occupational History   Not on file  Tobacco Use   Smoking status: Every Day    Packs/day: 0.50    Years: 20.00    Additional pack years: 0.00    Total pack years: 10.00    Types: Cigarettes   Smokeless tobacco: Never  Vaping Use   Vaping Use:  Never used  Substance and Sexual Activity   Alcohol use: Yes    Comment: 4x/week-3-4 beers, glasses of wine, or mixed drinks.   Drug use: No   Sexual activity: Yes    Birth control/protection: None  Other Topics Concern   Not on file  Social History Narrative   Not on file   Social Determinants of Health   Financial Resource Strain: Not on file  Food Insecurity: Not on file  Transportation Needs: Not on file  Physical Activity: Not on file  Stress: Not on file  Social Connections: Not on file  Intimate Partner Violence: Not on file    Review of Systems   General: Negative for anorexia, weight loss, fever, chills, fatigue, weakness. Eyes: Negative for vision changes.  ENT: Negative for hoarseness, difficulty swallowing , nasal congestion. CV: Negative for chest pain, angina, palpitations, dyspnea on exertion, peripheral edema.  Respiratory: Negative for dyspnea at rest, dyspnea on exertion, cough, sputum, wheezing.  GI: See history of present illness. GU:  Negative for dysuria, hematuria, urinary incontinence, urinary frequency, nocturnal urination.  MS: Negative for joint pain, low back pain.  Derm: Negative for rash or itching.  Neuro: Negative for weakness, abnormal sensation, seizure, frequent headaches, memory loss,  confusion.  Psych: Negative for anxiety, depression, suicidal ideation, hallucinations.  Endo: Negative for unusual weight change.  Heme: Negative for bruising or bleeding. Allergy: Negative for rash or hives.  Physical Exam   BP (!) 149/97 (BP Location: Right Arm, Patient Position: Sitting, Cuff Size: Large)   Pulse 86   Temp 97.7 F (36.5 C) (Oral)   Ht 5\' 11"  (1.803 m)   Wt 204 lb 9.6 oz (92.8 kg)   SpO2 95%   BMI 28.54 kg/m    General: Well-nourished, well-developed in no acute distress.  Head: Normocephalic, atraumatic.   Eyes: Conjunctiva pink, no icterus. Mouth: Oropharyngeal mucosa moist and pink   Neck: Supple without thyromegaly,  masses, or lymphadenopathy.  Lungs: Clear to auscultation bilaterally.  Heart: Regular rate and rhythm, no murmurs rubs or gallops.  Abdomen: Bowel sounds are normal, nontender, nondistended, no hepatosplenomegaly or masses,  no abdominal bruits or hernia, no rebound or guarding.   Rectal: not performed Extremities: No lower extremity edema. No clubbing or deformities.  Neuro: Alert and oriented x 4 , grossly normal neurologically.  Skin: Warm and dry, no rash or jaundice.   Psych: Alert and cooperative, normal mood and affect.  Labs   Lab Results  Component Value Date   CREATININE 0.80 04/12/2023   BUN 11 04/12/2023   NA 137 04/12/2023   K 3.7 04/12/2023   CL 102 04/12/2023   CO2 24 04/12/2023   Lab Results  Component Value Date   WBC 11.1 (H) 04/12/2023   HGB 15.6 04/12/2023   HCT 44.5 04/12/2023   MCV 91.4 04/12/2023  PLT 222 04/12/2023    Imaging Studies   No results found.  Assessment   Colon cancer screening: History of sigmoid colon stricture, perforated diverticulitis requiring partial colectomy with colostomy and subsequent reversal in 2018/2019. Colonoscopy was incomplete at that time due to poor prep.  Overdue for colon cancer screening.   PLAN   Colonoscopy with Dr. Jena Gauss.  ASA 2.  I have discussed the risks, alternatives, benefits with regards to but not limited to the risk of reaction to medication, bleeding, infection, perforation and the patient is agreeable to proceed. Written consent to be obtained.    Leanna Battles. Melvyn Neth, MHS, PA-C [facility]

## 2023-05-22 NOTE — H&P (View-Only) (Signed)
GI Office Note    Referring Provider: Assunta Found, MD Primary Care Physician:  Assunta Found, MD  Primary Gastroenterologist: Roetta Sessions, MD   Chief Complaint   Chief Complaint  Patient presents with   Colonoscopy   History of Present Illness   Steven Mcbride is a 52 y.o. male presenting today to schedule surveillance colonoscopy.   Last colonoscopy 2018. Noted to have diverticulosis of the sigmoid colon and descending colon. 15cm long benign-appearing stricture in the sigmoid colon, difficult to negotiate the scope through it. Cecum was reached but above stricture prep was poor. Biopsies benign. He presented back to ED same day after colonoscopy with CT findings of small amount of free intra-abdominal air, c/w perforated acute diverticulitis. Required sigmoid colectomy with end colostomy. He subsequently had colostomy reversal. He was advised to complete colonoscopy after recovery.  Patient states he is ready to schedule screening colonoscopy. He has been feeling great since his colon surgery. He had not realized how sick he had been before. No longer having any bowel issues or abdominal pain. BMs daily. No melena, brbpr. No ugi symptoms.    Medications   Current Outpatient Medications  Medication Sig Dispense Refill   acetaminophen (TYLENOL) 325 MG tablet Take 650 mg by mouth every 6 (six) hours as needed for mild pain.     colchicine 0.6 MG tablet Take 2 tablet by mouth at onset, and then take 1 tablet 1 hour later.  Take 1 tablet twice daily for 2 more days as needed for gout flare 30 tablet 5   olmesartan (BENICAR) 40 MG tablet Take 40 mg by mouth daily.     rosuvastatin (CRESTOR) 20 MG tablet Take 20 mg by mouth daily.     No current facility-administered medications for this visit.    Allergies   Allergies as of 05/22/2023   (No Known Allergies)    Past Medical History   Past Medical History:  Diagnosis Date   Diverticulitis    Gout    Hypertension      Past Surgical History   Past Surgical History:  Procedure Laterality Date   BIOPSY  09/16/2017   Procedure: BIOPSY;  Surgeon: Corbin Ade, MD;  Location: [facility];  Service: Endoscopy;;  sigmoid colon;   COLONOSCOPY  09/15/2017   Dr. Jena Gauss: scattered small and large-mouthed diverticula were found in sigmoid and descending colon. Benign-appearing, intrinsic severe stenosis in sigmoid colon that was chronically inflamed appearing, fibrotic, hypertrophied mucosa involving 15 cm segment of proximal sigmoid colon. Produced significant narrowing. Upstream in colon was formed and liquid stool, precluding entire evaluation.    COLONOSCOPY WITH PROPOFOL N/A 09/16/2017   Procedure: COLONOSCOPY WITH PROPOFOL;  Surgeon: Corbin Ade, MD;  Location: [facility];  Service: Endoscopy;  Laterality: N/A;  9:15am   COLOSTOMY N/A 09/17/2017   Procedure: COLOSTOMY;  Surgeon: Lucretia Roers, MD;  Location: [facility];  Service: General;  Laterality: N/A;   COLOSTOMY REVERSAL N/A 11/25/2017   Procedure: COLOSTOMY REVERSAL;  Surgeon: Lucretia Roers, MD;  Location: [facility];  Service: General;  Laterality: N/A;   None     PARTIAL COLECTOMY N/A 09/17/2017   Procedure: SIGMOID  COLECTOMY;  Surgeon: Lucretia Roers, MD;  Location: [facility];  Service: General;  Laterality: N/A;    Past Family History   Family History  Problem Relation Age of Onset   Hypertension Father    Lung cancer Father    Colon cancer Neg  Hx     Past Social History   Social History   Socioeconomic History   Marital status: Married    Spouse name: Not on file   Number of children: Not on file   Years of education: Not on file   Highest education level: Not on file  Occupational History   Not on file  Tobacco Use   Smoking status: Every Day    Packs/day: 0.50    Years: 20.00    Additional pack years: 0.00    Total pack years: 10.00    Types: Cigarettes   Smokeless tobacco: Never  Vaping Use   Vaping Use:  Never used  Substance and Sexual Activity   Alcohol use: Yes    Comment: 4x/week-3-4 beers, glasses of wine, or mixed drinks.   Drug use: No   Sexual activity: Yes    Birth control/protection: None  Other Topics Concern   Not on file  Social History Narrative   Not on file   Social Determinants of Health   Financial Resource Strain: Not on file  Food Insecurity: Not on file  Transportation Needs: Not on file  Physical Activity: Not on file  Stress: Not on file  Social Connections: Not on file  Intimate Partner Violence: Not on file    Review of Systems   General: Negative for anorexia, weight loss, fever, chills, fatigue, weakness. Eyes: Negative for vision changes.  ENT: Negative for hoarseness, difficulty swallowing , nasal congestion. CV: Negative for chest pain, angina, palpitations, dyspnea on exertion, peripheral edema.  Respiratory: Negative for dyspnea at rest, dyspnea on exertion, cough, sputum, wheezing.  GI: See history of present illness. GU:  Negative for dysuria, hematuria, urinary incontinence, urinary frequency, nocturnal urination.  MS: Negative for joint pain, low back pain.  Derm: Negative for rash or itching.  Neuro: Negative for weakness, abnormal sensation, seizure, frequent headaches, memory loss,  confusion.  Psych: Negative for anxiety, depression, suicidal ideation, hallucinations.  Endo: Negative for unusual weight change.  Heme: Negative for bruising or bleeding. Allergy: Negative for rash or hives.  Physical Exam   BP (!) 149/97 (BP Location: Right Arm, Patient Position: Sitting, Cuff Size: Large)   Pulse 86   Temp 97.7 F (36.5 C) (Oral)   Ht 5\' 11"  (1.803 m)   Wt 204 lb 9.6 oz (92.8 kg)   SpO2 95%   BMI 28.54 kg/m    General: Well-nourished, well-developed in no acute distress.  Head: Normocephalic, atraumatic.   Eyes: Conjunctiva pink, no icterus. Mouth: Oropharyngeal mucosa moist and pink   Neck: Supple without thyromegaly,  masses, or lymphadenopathy.  Lungs: Clear to auscultation bilaterally.  Heart: Regular rate and rhythm, no murmurs rubs or gallops.  Abdomen: Bowel sounds are normal, nontender, nondistended, no hepatosplenomegaly or masses,  no abdominal bruits or hernia, no rebound or guarding.   Rectal: not performed Extremities: No lower extremity edema. No clubbing or deformities.  Neuro: Alert and oriented x 4 , grossly normal neurologically.  Skin: Warm and dry, no rash or jaundice.   Psych: Alert and cooperative, normal mood and affect.  Labs   Lab Results  Component Value Date   CREATININE 0.80 04/12/2023   BUN 11 04/12/2023   NA 137 04/12/2023   K 3.7 04/12/2023   CL 102 04/12/2023   CO2 24 04/12/2023   Lab Results  Component Value Date   WBC 11.1 (H) 04/12/2023   HGB 15.6 04/12/2023   HCT 44.5 04/12/2023   MCV 91.4 04/12/2023  PLT 222 04/12/2023    Imaging Studies   No results found.  Assessment   Colon cancer screening: History of sigmoid colon stricture, perforated diverticulitis requiring partial colectomy with colostomy and subsequent reversal in 2018/2019. Colonoscopy was incomplete at that time due to poor prep.  Overdue for colon cancer screening.   PLAN   Colonoscopy with Dr. Jena Gauss.  ASA 2.  I have discussed the risks, alternatives, benefits with regards to but not limited to the risk of reaction to medication, bleeding, infection, perforation and the patient is agreeable to proceed. Written consent to be obtained.    Leanna Battles. Melvyn Neth, MHS, PA-C [facility]

## 2023-05-22 NOTE — Patient Instructions (Signed)
Colonoscopy to be scheduled. See separate instructions.  If you have any concerns that your prep is not working to clean you out the night before your procedure, please call the on call provider at [phone].

## 2023-05-23 ENCOUNTER — Encounter: Payer: Self-pay | Admitting: Gastroenterology

## 2023-06-06 ENCOUNTER — Encounter (HOSPITAL_COMMUNITY): Payer: Self-pay | Admitting: Internal Medicine

## 2023-06-06 ENCOUNTER — Encounter (HOSPITAL_COMMUNITY)
Admission: RE | Disposition: A | Discharge status: Home / Self Care | Payer: Self-pay | Source: Ambulatory Visit | Attending: Internal Medicine

## 2023-06-06 ENCOUNTER — Ambulatory Visit (HOSPITAL_COMMUNITY)
Admission: RE | Admit: 2023-06-06 | Discharge: 2023-06-06 | Disposition: A | Discharge status: Home / Self Care | Payer: BC Managed Care – PPO | Source: Ambulatory Visit | Attending: Internal Medicine | Admitting: Internal Medicine

## 2023-06-06 ENCOUNTER — Ambulatory Visit (HOSPITAL_COMMUNITY): Payer: BC Managed Care – PPO | Admitting: Anesthesiology

## 2023-06-06 ENCOUNTER — Other Ambulatory Visit: Payer: Self-pay

## 2023-06-06 DIAGNOSIS — K635 Polyp of colon: Secondary | ICD-10-CM | POA: Diagnosis not present

## 2023-06-06 DIAGNOSIS — K640 First degree hemorrhoids: Secondary | ICD-10-CM | POA: Insufficient documentation

## 2023-06-06 DIAGNOSIS — Z933 Colostomy status: Secondary | ICD-10-CM | POA: Insufficient documentation

## 2023-06-06 DIAGNOSIS — F17210 Nicotine dependence, cigarettes, uncomplicated: Secondary | ICD-10-CM | POA: Diagnosis not present

## 2023-06-06 DIAGNOSIS — Z1211 Encounter for screening for malignant neoplasm of colon: Secondary | ICD-10-CM | POA: Insufficient documentation

## 2023-06-06 DIAGNOSIS — Z980 Intestinal bypass and anastomosis status: Secondary | ICD-10-CM | POA: Diagnosis not present

## 2023-06-06 DIAGNOSIS — D122 Benign neoplasm of ascending colon: Secondary | ICD-10-CM | POA: Insufficient documentation

## 2023-06-06 DIAGNOSIS — Z9049 Acquired absence of other specified parts of digestive tract: Secondary | ICD-10-CM | POA: Diagnosis not present

## 2023-06-06 HISTORY — PX: POLYPECTOMY: SHX5525

## 2023-06-06 HISTORY — PX: COLONOSCOPY WITH PROPOFOL: SHX5780

## 2023-06-06 SURGERY — COLONOSCOPY WITH PROPOFOL
Anesthesia: General

## 2023-06-06 MED ORDER — PROPOFOL 500 MG/50ML IV EMUL
INTRAVENOUS | Status: DC | PRN
  Administered 2023-06-06: 150 ug/kg/min via INTRAVENOUS

## 2023-06-06 MED ORDER — LIDOCAINE HCL (CARDIAC) PF 100 MG/5ML IV SOSY
PREFILLED_SYRINGE | INTRAVENOUS | Status: DC | PRN
  Administered 2023-06-06: 80 mg via INTRAVENOUS

## 2023-06-06 MED ORDER — PHENYLEPHRINE 80 MCG/ML (10ML) SYRINGE FOR IV PUSH (FOR BLOOD PRESSURE SUPPORT)
PREFILLED_SYRINGE | INTRAVENOUS | Status: DC | PRN
  Administered 2023-06-06 (×2): 160 ug via INTRAVENOUS

## 2023-06-06 MED ORDER — PHENYLEPHRINE 80 MCG/ML (10ML) SYRINGE FOR IV PUSH (FOR BLOOD PRESSURE SUPPORT)
PREFILLED_SYRINGE | INTRAVENOUS | Status: AC
  Filled 2023-06-06: qty 10

## 2023-06-06 MED ORDER — LACTATED RINGERS IV SOLN: INTRAVENOUS | Status: DC

## 2023-06-06 MED ORDER — PROPOFOL 10 MG/ML IV BOLUS
INTRAVENOUS | Status: DC | PRN
Start: 2023-06-06 — End: 2023-06-06
  Administered 2023-06-06: 40 mg via INTRAVENOUS
  Administered 2023-06-06: 130 mg via INTRAVENOUS

## 2023-06-06 NOTE — Interval H&P Note (Signed)
History and Physical Interval Note:  06/06/2023 11:58 AM  Steven Mcbride  has presented today for surgery, with the diagnosis of screening colon cancer.  The various methods of treatment have been discussed with the patient and family. After consideration of risks, benefits and other options for treatment, the patient has consented to  Procedure(s) with comments: COLONOSCOPY WITH PROPOFOL (N/A) - 1:00 pm, asa 2 as a surgical intervention.  The patient's history has been reviewed, patient examined, no change in status, stable for surgery.  I have reviewed the patient's chart and labs.  Questions were answered to the patient's satisfaction.     Steven Mcbride   no change.    Screening colonoscopy per plan.  The risks, benefits, limitations, alternatives and imponderables have been reviewed with the patient. Questions have been answered. All parties are agreeable.

## 2023-06-06 NOTE — Op Note (Signed)
[facility] Patient Name: Steven Mcbride Procedure Date: 06/06/2023 12:00 PM MRN: [id number] Date of Birth: 1971-07-27 Attending MD: Gennette Pac , MD, [id number] CSN: [id number] Age: 52 Admit Type: Outpatient Procedure:                Colonoscopy Indications:              Screening for colorectal malignant neoplasm Providers:                Gennette Pac, MD, Edrick Kins, RN,                            Zena Amos Referring MD:              Medicines:                Propofol per Anesthesia Complications:            No immediate complications. Estimated Blood Loss:     Estimated blood loss was minimal. Procedure:                Pre-Anesthesia Assessment:                           - Prior to the procedure, a History and Physical                            was performed, and patient medications and                            allergies were reviewed. The patient's tolerance of                            previous anesthesia was also reviewed. The risks                            and benefits of the procedure and the sedation                            options and risks were discussed with the patient.                            All questions were answered, and informed consent                            was obtained. Prior Anticoagulants: The patient has                            taken no anticoagulant or antiplatelet agents. ASA                            Grade Assessment: II - A patient with mild systemic                            disease. After reviewing the risks and benefits,  the patient was deemed in satisfactory condition to                            undergo the procedure.                           After obtaining informed consent, the colonoscope                            was passed under direct vision. Throughout the                            procedure, the patient's blood pressure, pulse, and                             oxygen saturations were monitored continuously. The                            [id number]) scope was introduced through the                            anus and advanced to the the cecum, identified by                            appendiceal orifice and ileocecal valve. The                            colonoscopy was performed without difficulty. The                            patient tolerated the procedure well. The quality                            of the bowel preparation was adequate. The entire                            colon was well visualized. Scope In: 12:14:28 PM Scope Out: 12:30:32 PM Scope Withdrawal Time: 0 hours 10 minutes 22 seconds  Total Procedure Duration: 0 hours 16 minutes 4 seconds  Findings:      The perianal and digital rectal examinations were normal.      Non-bleeding internal hemorrhoids were found during retroflexion. The       hemorrhoids were moderate, medium-sized and Grade I (internal       hemorrhoids that do not prolapse).      A 5 mm polyp was found in the mid ascending colon. The polyp was       sessile. The polyp was removed with a cold snare. Resection and       retrieval were complete. Estimated blood loss was minimal. Surgical       anastomosis at 20 cm.      The exam was otherwise without abnormality on direct and retroflexion       views. I did not identify a single diverticulum today. Impression:               - Non-bleeding internal hemorrhoids. Surgical  anastomosis at 20 cm.                           - One 5 mm polyp in the mid ascending colon,                            removed with a cold snare. Resected and retrieved.                           - The examination was otherwise normal on direct                            and retroflexion views. Moderate Sedation:      Moderate (conscious) sedation was personally administered by an       anesthesia professional. The following parameters were monitored: oxygen        saturation, heart rate, blood pressure, respiratory rate, EKG, adequacy       of pulmonary ventilation, and response to care. Recommendation:           - Patient has a contact number available for                            emergencies. The signs and symptoms of potential                            delayed complications were discussed with the                            patient. Return to normal activities tomorrow.                            Written discharge instructions were provided to the                            patient.                           - Advance diet as tolerated.                           - Continue present medications.                           - Repeat colonoscopy after studies are complete for                            surveillance.                           - Return to GI office (date not yet determined). Procedure Code(s):        --- Professional ---                           [phone], Colonoscopy, flexible; with removal of  tumor(s), polyp(s), or other lesion(s) by snare                            technique Diagnosis Code(s):        --- Professional ---                           Z12.11, Encounter for screening for malignant                            neoplasm of colon                           D12.2, Benign neoplasm of ascending colon                           K64.0, First degree hemorrhoids CPT copyright 2022 [employer]. All rights reserved. The codes documented in this report are preliminary and upon coder review may  be revised to meet current compliance requirements. Gerrit Friends. Ginny Loomer, MD Gennette Pac, MD 06/06/2023 12:38:29 PM This report has been signed electronically. Number of Addenda: 0

## 2023-06-06 NOTE — Discharge Instructions (Addendum)
  Colonoscopy Discharge Instructions  Read the instructions outlined below and refer to this sheet in the next few weeks. These discharge instructions provide you with general information on caring for yourself after you leave the hospital. Your doctor may also give you specific instructions. While your treatment has been planned according to the most current medical practices available, unavoidable complications occasionally occur. If you have any problems or questions after discharge, call Dr. Jena Gauss at [phone]. ACTIVITY You may resume your regular activity, but move at a slower pace for the next 24 hours.  Take frequent rest periods for the next 24 hours.  Walking will help get rid of the air and reduce the bloated feeling in your belly (abdomen).  No driving for 24 hours (because of the medicine (anesthesia) used during the test).   Do not sign any important legal documents or operate any machinery for 24 hours (because of the anesthesia used during the test).  NUTRITION Drink plenty of fluids.  You may resume your normal diet as instructed by your doctor.  Begin with a light meal and progress to your normal diet. Heavy or fried foods are harder to digest and may make you feel sick to your stomach (nauseated).  Avoid alcoholic beverages for 24 hours or as instructed.  MEDICATIONS You may resume your normal medications unless your doctor tells you otherwise.  WHAT YOU CAN EXPECT TODAY Some feelings of bloating in the abdomen.  Passage of more gas than usual.  Spotting of blood in your stool or on the toilet paper.  IF YOU HAD POLYPS REMOVED DURING THE COLONOSCOPY: No aspirin products for 7 days or as instructed.  No alcohol for 7 days or as instructed.  Eat a soft diet for the next 24 hours.  FINDING OUT THE RESULTS OF YOUR TEST Not all test results are available during your visit. If your test results are not back during the visit, make an appointment with your caregiver to find out the  results. Do not assume everything is normal if you have not heard from your caregiver or the medical facility. It is important for you to follow up on all of your test results.  SEEK IMMEDIATE MEDICAL ATTENTION IF: You have more than a spotting of blood in your stool.  Your belly is swollen (abdominal distention).  You are nauseated or vomiting.  You have a temperature over 101.  You have abdominal pain or discomfort that is severe or gets worse throughout the day.       You had 1 polyp on the right side of your colon which was removed  Further recommendations to follow once I get the lab report back   I did not see a single diverticulum remaining in your colon   you have healed very nicely from your prior colon surgery   at patient request, call Sammy at [phone]-rolled to voicemail.  Left a detailed message.

## 2023-06-06 NOTE — Transfer of Care (Signed)
Immediate Anesthesia Transfer of Care Note  Patient: Steven Mcbride  Procedure(s) Performed: COLONOSCOPY WITH PROPOFOL POLYPECTOMY  Patient Location: PACU  Anesthesia Type:General  Level of Consciousness: drowsy and patient cooperative  Airway & Oxygen Therapy: Patient Spontanous Breathing  Post-op Assessment: Report given to RN and Post -op Vital signs reviewed and stable  Post vital signs: Reviewed and stable  Last Vitals:  Vitals Value Taken Time  BP 94/55 06/06/23 1235  Temp 36.5 C 06/06/23 1235  Pulse 77 06/06/23 1235  Resp 16 06/06/23 1235  SpO2 96 % 06/06/23 1235    Last Pain:  Vitals:   06/06/23 1237  TempSrc:   PainSc: 0-No pain      Patients Stated Pain Goal: 6 (06/06/23 1146)  Complications: No notable events documented.

## 2023-06-06 NOTE — Anesthesia Preprocedure Evaluation (Signed)
Anesthesia Evaluation  Patient identified by MRN, date of birth, ID band Patient awake    Reviewed: Allergy & Precautions, H&P , NPO status , Patient's Chart, lab work & pertinent test results, reviewed documented beta blocker date and time   Airway Mallampati: II  TM Distance: >3 FB Neck ROM: full    Dental no notable dental hx.    Pulmonary neg pulmonary ROS, Current Smoker   Pulmonary exam normal breath sounds clear to auscultation       Cardiovascular Exercise Tolerance: Good hypertension, negative cardio ROS  Rhythm:regular Rate:Normal     Neuro/Psych negative neurological ROS  negative psych ROS   GI/Hepatic negative GI ROS, Neg liver ROS,,,  Endo/Other  negative endocrine ROS    Renal/GU negative Renal ROS  negative genitourinary   Musculoskeletal   Abdominal   Peds  Hematology negative hematology ROS (+)   Anesthesia Other Findings   Reproductive/Obstetrics negative OB ROS                             Anesthesia Physical Anesthesia Plan  ASA: 2  Anesthesia Plan: General   Post-op Pain Management:    Induction:   PONV Risk Score and Plan: Propofol infusion  Airway Management Planned:   Additional Equipment:   Intra-op Plan:   Post-operative Plan:   Informed Consent: I have reviewed the patients History and Physical, chart, labs and discussed the procedure including the risks, benefits and alternatives for the proposed anesthesia with the patient or authorized representative who has indicated his/her understanding and acceptance.     Dental Advisory Given  Plan Discussed with: CRNA  Anesthesia Plan Comments:        Anesthesia Quick Evaluation

## 2023-06-10 ENCOUNTER — Encounter: Payer: Self-pay | Admitting: Internal Medicine

## 2023-06-11 NOTE — Anesthesia Postprocedure Evaluation (Signed)
Anesthesia Post Note  Patient: Steven Mcbride  Procedure(s) Performed: COLONOSCOPY WITH PROPOFOL POLYPECTOMY  Patient location during evaluation: Phase II Anesthesia Type: General Level of consciousness: awake Pain management: pain level controlled Vital Signs Assessment: post-procedure vital signs reviewed and stable Respiratory status: spontaneous breathing and respiratory function stable Cardiovascular status: blood pressure returned to baseline and stable Postop Assessment: no headache and no apparent nausea or vomiting Anesthetic complications: no Comments: Late entry   No notable events documented.   Last Vitals:  Vitals:   06/06/23 1146 06/06/23 1235  BP: (!) 140/91 (!) 94/55  Pulse: 97 77  Resp: 18 16  Temp: 37.1 C 36.5 C  SpO2: 97% 96%    Last Pain:  Vitals:   06/06/23 1237  TempSrc:   PainSc: 0-No pain                 Windell [place]

## 2023-06-12 ENCOUNTER — Encounter (HOSPITAL_COMMUNITY): Payer: Self-pay | Admitting: Internal Medicine

## 2023-12-18 DIAGNOSIS — E782 Mixed hyperlipidemia: Secondary | ICD-10-CM | POA: Diagnosis not present

## 2023-12-18 DIAGNOSIS — D72829 Elevated white blood cell count, unspecified: Secondary | ICD-10-CM | POA: Diagnosis not present

## 2023-12-18 DIAGNOSIS — M26609 Unspecified temporomandibular joint disorder, unspecified side: Secondary | ICD-10-CM | POA: Diagnosis not present

## 2023-12-18 DIAGNOSIS — E7849 Other hyperlipidemia: Secondary | ICD-10-CM | POA: Diagnosis not present

## 2023-12-18 DIAGNOSIS — I10 Essential (primary) hypertension: Secondary | ICD-10-CM | POA: Diagnosis not present

## 2023-12-18 DIAGNOSIS — Z0000 Encounter for general adult medical examination without abnormal findings: Secondary | ICD-10-CM | POA: Diagnosis not present

## 2023-12-18 DIAGNOSIS — Z6830 Body mass index (BMI) 30.0-30.9, adult: Secondary | ICD-10-CM | POA: Diagnosis not present

## 2023-12-18 DIAGNOSIS — M25511 Pain in right shoulder: Secondary | ICD-10-CM | POA: Diagnosis not present

## 2023-12-18 DIAGNOSIS — E6609 Other obesity due to excess calories: Secondary | ICD-10-CM | POA: Diagnosis not present

## 2024-05-12 DIAGNOSIS — M26609 Unspecified temporomandibular joint disorder, unspecified side: Secondary | ICD-10-CM | POA: Diagnosis not present

## 2024-05-12 DIAGNOSIS — E782 Mixed hyperlipidemia: Secondary | ICD-10-CM | POA: Diagnosis not present

## 2024-05-12 DIAGNOSIS — Z1331 Encounter for screening for depression: Secondary | ICD-10-CM | POA: Diagnosis not present

## 2024-05-12 DIAGNOSIS — R7309 Other abnormal glucose: Secondary | ICD-10-CM | POA: Diagnosis not present

## 2024-05-12 DIAGNOSIS — Z0000 Encounter for general adult medical examination without abnormal findings: Secondary | ICD-10-CM | POA: Diagnosis not present

## 2024-05-12 DIAGNOSIS — Z0001 Encounter for general adult medical examination with abnormal findings: Secondary | ICD-10-CM | POA: Diagnosis not present

## 2024-05-12 DIAGNOSIS — Z6830 Body mass index (BMI) 30.0-30.9, adult: Secondary | ICD-10-CM | POA: Diagnosis not present

## 2024-05-12 DIAGNOSIS — E6609 Other obesity due to excess calories: Secondary | ICD-10-CM | POA: Diagnosis not present

## 2024-05-12 DIAGNOSIS — K5730 Diverticulosis of large intestine without perforation or abscess without bleeding: Secondary | ICD-10-CM | POA: Diagnosis not present

## 2024-05-12 DIAGNOSIS — D72829 Elevated white blood cell count, unspecified: Secondary | ICD-10-CM | POA: Diagnosis not present

## 2024-05-12 DIAGNOSIS — M25511 Pain in right shoulder: Secondary | ICD-10-CM | POA: Diagnosis not present

## 2024-05-12 DIAGNOSIS — E7849 Other hyperlipidemia: Secondary | ICD-10-CM | POA: Diagnosis not present

## 2024-05-12 DIAGNOSIS — I10 Essential (primary) hypertension: Secondary | ICD-10-CM | POA: Diagnosis not present

## 2024-05-20 ENCOUNTER — Encounter: Payer: Self-pay | Admitting: *Deleted

## 2024-06-02 DIAGNOSIS — M25511 Pain in right shoulder: Secondary | ICD-10-CM | POA: Diagnosis not present

## 2024-07-02 ENCOUNTER — Other Ambulatory Visit: Payer: Self-pay

## 2024-07-02 ENCOUNTER — Encounter: Payer: Self-pay | Admitting: *Deleted

## 2024-07-02 NOTE — Patient Outreach (Signed)
 Clarrifation unsuccessful call attempt to patient  Patient with unsuccessful outreach CMA/leadership inquiry if patient will be continuing services with Dr Marvine   RN CCM attempted to call patient to find out to help with rescheduling patient with correct RN CCM  Left patient a message with the inquiry  Plan Speak with patient if he returns a call or update [facility]BCI staff if patient leaves a message  Katherine Tout L. Ramonita, RN, BSN, CCM [place]  [facility], [facility] RN Care Manager Direct Dial: [phone]  Fax: [phone]

## 2024-07-02 NOTE — Patient Instructions (Signed)
 Visit Information  Thank you for taking time to visit with me today. Please don't hesitate to contact me if I can be of assistance to you before our next scheduled appointment.  Our next appointment is no further scheduled appointments.    We look forward to seeing you at [facility] at [facility] Please call the care guide team at [phone] if you need to cancel or reschedule your appointment.   Following is a copy of your care plan:   Goals Addressed             This Visit's Progress    COMPLETED: care gaps VBCI RN Care Plan   Improving    Problems:  Care gaps for vaccines flu, pneumonia, shingles & hepatitis B C screening, shingles   Goal: Over the next 3 months the Patient will work with his pcp staff  to obtain his vaccines & close any care gaps  as evidenced by review of electronic medical record and patient or care team member report    Interventions:   Health Maintenance Interventions: Patient interviewed about adult health maintenance status including  Pneumonia Vaccine Influenza Vaccine Shingles, hepatitis B Hepatitis c screening  Advised to discuss  these care gaps/vaccines  with primary care provider  Provided education about shingles shot +  Patient Self-Care Activities:  Attend all scheduled provider appointments Call provider office for new concerns or questions  Take medications as prescribed    Plan:  No further follow up required: patient will work with his pcp office to close these gaps             Please call the Suicide and Crisis Lifeline: 988 call the [place]  National Suicide Prevention Lifeline: (469)453-4477 or TTY: 484-588-9148 TTY 310-044-5336) to talk to a trained counselor call 1-800-273-TALK (toll free, 24 hour hotline) call the [facility]: (573)613-1116 call 911 if you are experiencing a Mental Health or Behavioral Health Crisis or need someone to talk to.  Patient verbalizes understanding of instructions and care plan  provided today and agrees to view in MyChart. Active MyChart status and patient understanding of how to access instructions and care plan via MyChart confirmed with patient.     Lashonna Rieke L. Ramonita, RN, BSN, CCM [place]  [facility], [facility] RN Care Manager Direct Dial: [phone]  Fax: [phone]

## 2024-07-02 NOTE — Patient Outreach (Signed)
 Complex Care Management   Visit Note  07/02/2024  Name:  Steven Mcbride MRN: [id number] DOB: 08-26-1971  Situation: Referral received for Complex Care Management related to care gaps I obtained verbal consent from Patient.  Visit completed with Patient  on the phone   Reports he has had most of the vaccines before He will follow up on the Shingles shot at a local pharmacy like [facility] Never completed hepatitis C screening and agrees for pcp to completer order for this at the office Agrees for RN CCM to notify pcp of interest in getting flu, pneumonia, shingles & hepatitis vaccines as needed   He denies any other medical concerns related to his gout, hypertension and diverticulosis today    Background:   Past Medical History:  Diagnosis Date   Diverticulitis    Gout    Hypertension     Assessment: Patient Reported Symptoms:  Cognitive Cognitive Status: No symptoms reported, Alert and oriented to person, place, and time, Normal speech and language skills, Insightful and able to interpret abstract concepts Cognitive/Intellectual Conditions Management [RPT]: None reported or documented in medical history or problem list   Health Maintenance Behaviors: Social activities, Annual physical exam Healing Pattern: Average Health Facilitated by: Rest  Neurological Neurological Review of Symptoms: No symptoms reported Neurological Self-Management Outcome: 4 (good)  HEENT HEENT Symptoms Reported: No symptoms reported HEENT Self-Management Outcome: 4 (good)    Cardiovascular Cardiovascular Symptoms Reported: No symptoms reported Cardiovascular Self-Management Outcome: 4 (good)  Respiratory Respiratory Symptoms Reported: No symptoms reported Respiratory Self-Management Outcome: 4 (good)  Endocrine Endocrine Symptoms Reported: No symptoms reported Endocrine Self-Management Outcome: 4 (good)  Gastrointestinal Gastrointestinal Symptoms Reported: No symptoms reported Additional  Gastrointestinal Details: hx diverticulosis Gastrointestinal Self-Management Outcome: 4 (good)    Genitourinary Genitourinary Symptoms Reported: No symptoms reported Genitourinary Self-Management Outcome: 4 (good)  Integumentary Integumentary Symptoms Reported: No symptoms reported Skin Self-Management Outcome: 4 (good)  Musculoskeletal Musculoskelatal Symptoms Reviewed: Other Other Musculoskeletal Symptoms: gout flares but doing well today Musculoskeletal Management Strategies: Medication therapy, Routine screening Musculoskeletal Self-Management Outcome: 4 (good) Falls in the past year?: No Number of falls in past year: 1 or less Was there an injury with Fall?: No Fall Risk Category Calculator: 0 Patient Fall Risk Level: Low Fall Risk Patient at Risk for Falls Due to: No Fall Risks Fall risk Follow up: Falls evaluation completed  Psychosocial Psychosocial Symptoms Reported: No symptoms reported Behavioral Health Self-Management Outcome: 4 (good)   Quality of Family Relationships: helpful, supportive Do you feel physically threatened by others?: No    07/02/2024    PHQ2-9 Depression Screening   Little interest or pleasure in doing things Not at all  Feeling down, depressed, or hopeless Not at all  PHQ-2 - Total Score 0  Trouble falling or staying asleep, or sleeping too much    Feeling tired or having little energy    Poor appetite or overeating     Feeling bad about yourself - or that you are a failure or have let yourself or your family down    Trouble concentrating on things, such as reading the newspaper or watching television    Moving or speaking so slowly that other people could have noticed.  Or the opposite - being so fidgety or restless that you have been moving around a lot more than usual    Thoughts that you would be better off dead, or hurting yourself in some way    PHQ2-9 Total Score    If you checked  off any problems, how difficult have these problems made it  for you to do your work, take care of things at home, or get along with other people    Depression Interventions/Treatment      There were no vitals filed for this visit.  Medications Reviewed Today   Medications were not reviewed in this encounter     Recommendation:   PCP Follow-up Continue Current Plan of Care  Follow Up Plan:   Patient will be transferring to [facility] office with Dr Marvine as his pcp case closure   [name], RN, BSN, CCM [place]  [facility], [facility] RN Care Manager Direct Dial: [phone]  Fax: [phone]

## 2024-08-06 NOTE — Progress Notes (Signed)
 YAHYA BOLDMAN                                          MRN: [id number]   08/06/2024   The VBCI Quality Team Specialist reviewed this patient medical record for the purposes of chart review for care gap closure. The following were reviewed: chart review for care gap closure-controlling blood pressure.    VBCI Quality Team
